# Patient Record
Sex: Male | Born: 1935 | Race: White | Hispanic: No | Smoking: Never smoker
Health system: Southern US, Community
[De-identification: ages and names within clinical notes are randomized; demographics above are authoritative.]

## PROBLEM LIST (undated history)

## (undated) DIAGNOSIS — I10 Essential (primary) hypertension: Secondary | ICD-10-CM

## (undated) DIAGNOSIS — C801 Malignant (primary) neoplasm, unspecified: Secondary | ICD-10-CM

## (undated) DIAGNOSIS — M1990 Unspecified osteoarthritis, unspecified site: Secondary | ICD-10-CM

## (undated) DIAGNOSIS — C61 Malignant neoplasm of prostate: Secondary | ICD-10-CM

## (undated) DIAGNOSIS — N179 Acute kidney failure, unspecified: Secondary | ICD-10-CM

## (undated) DIAGNOSIS — J189 Pneumonia, unspecified organism: Secondary | ICD-10-CM

## (undated) DIAGNOSIS — C4490 Unspecified malignant neoplasm of skin, unspecified: Secondary | ICD-10-CM

## (undated) HISTORY — DX: Acute kidney failure, unspecified: N17.9

## (undated) HISTORY — PX: BASAL CELL CARCINOMA EXCISION: SHX1214

## (undated) HISTORY — PX: PROSTATE BIOPSY: SHX241

## (undated) HISTORY — PX: TONSILLECTOMY: SUR1361

---

## 2009-12-15 ENCOUNTER — Emergency Department (HOSPITAL_BASED_OUTPATIENT_CLINIC_OR_DEPARTMENT_OTHER): Admission: EM | Admit: 2009-12-15 | Discharge: 2009-12-15 | Payer: Self-pay | Admitting: Emergency Medicine

## 2014-07-09 DIAGNOSIS — N179 Acute kidney failure, unspecified: Secondary | ICD-10-CM

## 2014-07-09 HISTORY — DX: Acute kidney failure, unspecified: N17.9

## 2014-08-01 ENCOUNTER — Inpatient Hospital Stay (HOSPITAL_COMMUNITY)
Admission: EM | Admit: 2014-08-01 | Discharge: 2014-08-05 | DRG: 684 | Disposition: A | Discharge status: Home Health | Payer: Medicare Other | Attending: Internal Medicine | Admitting: Internal Medicine

## 2014-08-01 ENCOUNTER — Emergency Department (HOSPITAL_COMMUNITY): Payer: Medicare Other

## 2014-08-01 ENCOUNTER — Encounter (HOSPITAL_COMMUNITY): Payer: Self-pay | Admitting: Emergency Medicine

## 2014-08-01 DIAGNOSIS — R339 Retention of urine, unspecified: Secondary | ICD-10-CM | POA: Diagnosis present

## 2014-08-01 DIAGNOSIS — D649 Anemia, unspecified: Secondary | ICD-10-CM | POA: Diagnosis not present

## 2014-08-01 DIAGNOSIS — Z85828 Personal history of other malignant neoplasm of skin: Secondary | ICD-10-CM | POA: Diagnosis not present

## 2014-08-01 DIAGNOSIS — I2789 Other specified pulmonary heart diseases: Secondary | ICD-10-CM | POA: Diagnosis present

## 2014-08-01 DIAGNOSIS — D509 Iron deficiency anemia, unspecified: Secondary | ICD-10-CM

## 2014-08-01 DIAGNOSIS — N400 Enlarged prostate without lower urinary tract symptoms: Secondary | ICD-10-CM

## 2014-08-01 DIAGNOSIS — I129 Hypertensive chronic kidney disease with stage 1 through stage 4 chronic kidney disease, or unspecified chronic kidney disease: Secondary | ICD-10-CM | POA: Diagnosis not present

## 2014-08-01 DIAGNOSIS — D638 Anemia in other chronic diseases classified elsewhere: Secondary | ICD-10-CM | POA: Diagnosis present

## 2014-08-01 DIAGNOSIS — N1330 Unspecified hydronephrosis: Secondary | ICD-10-CM | POA: Diagnosis present

## 2014-08-01 DIAGNOSIS — N401 Enlarged prostate with lower urinary tract symptoms: Secondary | ICD-10-CM | POA: Diagnosis present

## 2014-08-01 DIAGNOSIS — N138 Other obstructive and reflux uropathy: Secondary | ICD-10-CM | POA: Diagnosis present

## 2014-08-01 DIAGNOSIS — N059 Unspecified nephritic syndrome with unspecified morphologic changes: Secondary | ICD-10-CM | POA: Diagnosis present

## 2014-08-01 DIAGNOSIS — N058 Unspecified nephritic syndrome with other morphologic changes: Secondary | ICD-10-CM | POA: Diagnosis present

## 2014-08-01 DIAGNOSIS — N179 Acute kidney failure, unspecified: Principal | ICD-10-CM | POA: Diagnosis present

## 2014-08-01 DIAGNOSIS — N3090 Cystitis, unspecified without hematuria: Secondary | ICD-10-CM | POA: Diagnosis present

## 2014-08-01 DIAGNOSIS — N132 Hydronephrosis with renal and ureteral calculous obstruction: Secondary | ICD-10-CM

## 2014-08-01 DIAGNOSIS — N320 Bladder-neck obstruction: Secondary | ICD-10-CM | POA: Diagnosis not present

## 2014-08-01 DIAGNOSIS — I10 Essential (primary) hypertension: Secondary | ICD-10-CM | POA: Diagnosis present

## 2014-08-01 DIAGNOSIS — Z0389 Encounter for observation for other suspected diseases and conditions ruled out: Secondary | ICD-10-CM | POA: Diagnosis not present

## 2014-08-01 DIAGNOSIS — C61 Malignant neoplasm of prostate: Secondary | ICD-10-CM | POA: Diagnosis present

## 2014-08-01 DIAGNOSIS — N19 Unspecified kidney failure: Secondary | ICD-10-CM | POA: Diagnosis present

## 2014-08-01 DIAGNOSIS — R944 Abnormal results of kidney function studies: Secondary | ICD-10-CM | POA: Diagnosis not present

## 2014-08-01 HISTORY — DX: Malignant (primary) neoplasm, unspecified: C80.1

## 2014-08-01 LAB — BASIC METABOLIC PANEL
Anion gap: 16 — ABNORMAL HIGH (ref 5–15)
BUN: 52 mg/dL — ABNORMAL HIGH (ref 6–23)
CHLORIDE: 105 meq/L (ref 96–112)
CO2: 22 meq/L (ref 19–32)
CREATININE: 5.08 mg/dL — AB (ref 0.50–1.35)
Calcium: 8.7 mg/dL (ref 8.4–10.5)
GFR calc Af Amer: 11 mL/min — ABNORMAL LOW (ref >90)
GFR calc non Af Amer: 9 mL/min — ABNORMAL LOW (ref >90)
GLUCOSE: 86 mg/dL (ref 70–99)
Potassium: 4.2 mEq/L (ref 3.7–5.3)
Sodium: 143 mEq/L (ref 137–147)

## 2014-08-01 LAB — CBC WITH DIFFERENTIAL/PLATELET
Basophils Absolute: 0.1 10*3/uL (ref 0.0–0.1)
Basophils Relative: 1 % (ref 0–1)
EOS ABS: 0.2 10*3/uL (ref 0.0–0.7)
Eosinophils Relative: 2 % (ref 0–5)
HCT: 31.1 % — ABNORMAL LOW (ref 39.0–52.0)
Hemoglobin: 10.7 g/dL — ABNORMAL LOW (ref 13.0–17.0)
LYMPHS ABS: 1.6 10*3/uL (ref 0.7–4.0)
Lymphocytes Relative: 19 % (ref 12–46)
MCH: 29.3 pg (ref 26.0–34.0)
MCHC: 34.4 g/dL (ref 30.0–36.0)
MCV: 85.2 fL (ref 78.0–100.0)
MONOS PCT: 5 % (ref 3–12)
Monocytes Absolute: 0.4 10*3/uL (ref 0.1–1.0)
Neutro Abs: 6.4 10*3/uL (ref 1.7–7.7)
Neutrophils Relative %: 73 % (ref 43–77)
Platelets: 236 10*3/uL (ref 150–400)
RBC: 3.65 MIL/uL — AB (ref 4.22–5.81)
RDW: 13 % (ref 11.5–15.5)
WBC: 8.7 10*3/uL (ref 4.0–10.5)

## 2014-08-01 LAB — COMPREHENSIVE METABOLIC PANEL
ALK PHOS: 46 U/L (ref 39–117)
ALT: 7 U/L (ref 0–53)
AST: 12 U/L (ref 0–37)
Albumin: 3.8 g/dL (ref 3.5–5.2)
Anion gap: 15 (ref 5–15)
BUN: 53 mg/dL — ABNORMAL HIGH (ref 6–23)
CO2: 24 meq/L (ref 19–32)
Calcium: 9.1 mg/dL (ref 8.4–10.5)
Chloride: 105 mEq/L (ref 96–112)
Creatinine, Ser: 5.14 mg/dL — ABNORMAL HIGH (ref 0.50–1.35)
GFR calc Af Amer: 11 mL/min — ABNORMAL LOW (ref >90)
GFR, EST NON AFRICAN AMERICAN: 9 mL/min — AB (ref >90)
Glucose, Bld: 96 mg/dL (ref 70–99)
POTASSIUM: 4.8 meq/L (ref 3.7–5.3)
SODIUM: 144 meq/L (ref 137–147)
TOTAL PROTEIN: 7.8 g/dL (ref 6.0–8.3)
Total Bilirubin: 0.4 mg/dL (ref 0.3–1.2)

## 2014-08-01 LAB — URINALYSIS, ROUTINE W REFLEX MICROSCOPIC
BILIRUBIN URINE: NEGATIVE
Glucose, UA: NEGATIVE mg/dL
Hgb urine dipstick: NEGATIVE
Ketones, ur: NEGATIVE mg/dL
LEUKOCYTES UA: NEGATIVE
NITRITE: NEGATIVE
PH: 5.5 (ref 5.0–8.0)
Protein, ur: NEGATIVE mg/dL
Specific Gravity, Urine: 1.011 (ref 1.005–1.030)
Urobilinogen, UA: 0.2 mg/dL (ref 0.0–1.0)

## 2014-08-01 LAB — PHOSPHORUS: Phosphorus: 4.3 mg/dL (ref 2.3–4.6)

## 2014-08-01 LAB — RETICULOCYTES
RBC.: 3.32 MIL/uL — ABNORMAL LOW (ref 4.22–5.81)
Retic Count, Absolute: 13.3 10*3/uL — ABNORMAL LOW (ref 19.0–186.0)
Retic Ct Pct: 0.4 % (ref 0.4–3.1)

## 2014-08-01 LAB — TROPONIN I: Troponin I: <0.3 ng/mL (ref <0.30)

## 2014-08-01 LAB — MAGNESIUM: MAGNESIUM: 2.4 mg/dL (ref 1.5–2.5)

## 2014-08-01 MED ORDER — IOHEXOL 300 MG/ML  SOLN
25.0000 mL | Freq: Once | INTRAMUSCULAR | Status: AC | PRN
  Administered 2014-08-01: 25 mL via ORAL

## 2014-08-01 MED ORDER — ACETAMINOPHEN 650 MG RE SUPP: 650.0000 mg | Freq: Four times a day (QID) | RECTAL | Status: DC | PRN

## 2014-08-01 MED ORDER — SODIUM CHLORIDE 0.9 % IV SOLN
Freq: Once | INTRAVENOUS | Status: AC
  Administered 2014-08-01: 20:00:00 via INTRAVENOUS

## 2014-08-01 MED ORDER — HYDRALAZINE HCL 20 MG/ML IJ SOLN
5.0000 mg | INTRAMUSCULAR | Status: AC
  Administered 2014-08-01: 5 mg via INTRAVENOUS
  Filled 2014-08-01: qty 1

## 2014-08-01 MED ORDER — SODIUM CHLORIDE 0.9 % IV BOLUS (SEPSIS)
1000.0000 mL | Freq: Once | INTRAVENOUS | Status: AC
  Administered 2014-08-01: 1000 mL via INTRAVENOUS

## 2014-08-01 MED ORDER — IOHEXOL 300 MG/ML  SOLN
100.0000 mL | Freq: Once | INTRAMUSCULAR | Status: AC | PRN
  Administered 2014-08-01: 100 mL via INTRAVENOUS

## 2014-08-01 MED ORDER — ACETAMINOPHEN 325 MG PO TABS: 650.0000 mg | ORAL_TABLET | Freq: Four times a day (QID) | ORAL | Status: DC | PRN

## 2014-08-01 NOTE — Consult Note (Addendum)
consult for: Urinary retention bilateral hydronephrosis, acute renal failure Requested by: Dr. Addison Lank Piepenbrink   History of Present Illness: Patient is an 80 year old male who complains of a several week history of weak stream and urinary incontinence. His incontinence comes on without awareness but also with urgency. He's had no dysuria or gross hematuria. He was noted to be in acute renal failure. His urinalysis was clear. A CT scan of the abdomen and pelvis was obtained which showed bilateral hydroureteronephrosis down to a distended bladder, the prostate was approximately 125 g, there are bilateral renal cyst 4 cm on right and a much smaller one on the left. I reviewed all the images.  The patient denies prior history of BPH or other urologic history. He reports he typically voids with a good stream without frequency and urgency up until weeks ago.  Past Medical History  Diagnosis Date  . Cancer    Past Surgical History  Procedure Laterality Date  . Basal cell carcinoma excision      Home Medications:   (Not in a hospital admission) Allergies: No Known Allergies  No family history on file. Social History:  reports that he has never smoked. He does not have any smokeless tobacco history on file. He reports that he does not drink alcohol. His drug history is not on file.  ROS: A complete review of systems was performed.  All systems are negative except for pertinent findings as noted. Review of Systems  All other systems reviewed and are negative.    Physical Exam:  Vital signs in last 24 hours: Temp:  [98.9 F (37.2 C)] 98.9 F (37.2 C) (08/24 1157) Pulse Rate:  [54-67] 63 (08/24 1915) Resp:  [11-21] 18 (08/24 1915) BP: (175-240)/(71-103) 213/72 mmHg (08/24 1915) SpO2:  [94 %-100 %] 98 % (08/24 1915) General:  Alert and oriented, No acute distress HEENT: Normocephalic, atraumatic Neck: No JVD or lymphadenopathy Cardiovascular: Regular rate and rhythm Lungs:  Regular rate and effort Abdomen: Soft, nontender, nondistended, bladder is tense and palpably distended above his umbilicus. Back: No CVA tenderness Extremities: No edema Neurologic: Grossly intact GU: The penis appears normal. The testicles are descended bilaterally and palpably normal.  I discussed with the patient the nature risks benefits and alternatives to Foley catheter. We discussed risks of bleeding and infection among others. He elected to proceed.  Procedure: The penis was prepped with Betadine and an 11 [place] coud catheter was placed without difficulty. The balloon was inflated and seated at the bladder neck. He drained about 2300 mL of clear yellow urine. He felt much relief. He remained stable.  Laboratory Data:  Results for orders placed during the hospital encounter of 08/01/14 (from the past 24 hour(s))  URINALYSIS, ROUTINE W REFLEX MICROSCOPIC     Status: None   Collection Time    08/01/14 12:03 PM      Result Value Ref Range   Color, Urine YELLOW  YELLOW   APPearance CLEAR  CLEAR   Specific Gravity, Urine 1.011  1.005 - 1.030   pH 5.5  5.0 - 8.0   Glucose, UA NEGATIVE  NEGATIVE mg/dL   Hgb urine dipstick NEGATIVE  NEGATIVE   Bilirubin Urine NEGATIVE  NEGATIVE   Ketones, ur NEGATIVE  NEGATIVE mg/dL   Protein, ur NEGATIVE  NEGATIVE mg/dL   Urobilinogen, UA 0.2  0.0 - 1.0 mg/dL   Nitrite NEGATIVE  NEGATIVE   Leukocytes, UA NEGATIVE  NEGATIVE  COMPREHENSIVE METABOLIC PANEL     Status: Abnormal  Collection Time    08/01/14  2:17 PM      Result Value Ref Range   Sodium 144  137 - 147 mEq/L   Potassium 4.8  3.7 - 5.3 mEq/L   Chloride 105  96 - 112 mEq/L   CO2 24  19 - 32 mEq/L   Glucose, Bld 96  70 - 99 mg/dL   BUN 53 (*) 6 - 23 mg/dL   Creatinine, Ser 5.14 (*) 0.50 - 1.35 mg/dL   Calcium 9.1  8.4 - 10.5 mg/dL   Total Protein 7.8  6.0 - 8.3 g/dL   Albumin 3.8  3.5 - 5.2 g/dL   AST 12  0 - 37 U/L   ALT 7  0 - 53 U/L   Alkaline Phosphatase 46  39 - 117 U/L    Total Bilirubin 0.4  0.3 - 1.2 mg/dL   GFR calc non Af Amer 9 (*) >90 mL/min   GFR calc Af Amer 11 (*) >90 mL/min   Anion gap 15  5 - 15  CBC WITH DIFFERENTIAL     Status: Abnormal   Collection Time    08/01/14  2:17 PM      Result Value Ref Range   WBC 8.7  4.0 - 10.5 K/uL   RBC 3.65 (*) 4.22 - 5.81 MIL/uL   Hemoglobin 10.7 (*) 13.0 - 17.0 g/dL   HCT 31.1 (*) 39.0 - 52.0 %   MCV 85.2  78.0 - 100.0 fL   MCH 29.3  26.0 - 34.0 pg   MCHC 34.4  30.0 - 36.0 g/dL   RDW 13.0  11.5 - 15.5 %   Platelets 236  150 - 400 K/uL   Neutrophils Relative % 73  43 - 77 %   Neutro Abs 6.4  1.7 - 7.7 K/uL   Lymphocytes Relative 19  12 - 46 %   Lymphs Abs 1.6  0.7 - 4.0 K/uL   Monocytes Relative 5  3 - 12 %   Monocytes Absolute 0.4  0.1 - 1.0 K/uL   Eosinophils Relative 2  0 - 5 %   Eosinophils Absolute 0.2  0.0 - 0.7 K/uL   Basophils Relative 1  0 - 1 %   Basophils Absolute 0.1  0.0 - 0.1 K/uL   No results found for this or any previous visit (from the past 240 hour(s)). Creatinine:  Recent Labs  08/01/14 1417  CREATININE 5.14*    Impression/Assessment/plan: BPH - we discussed the long-term management of BPH and the nature risk and benefits of continued surveillance, alpha blockers, 5 alpha reductase inhibitors and procedures. He needs to be started on combination therapy with tamsulosin and finasteride. He wants to avoid surgery. We discussed FDA warnings regarding finasteride such as sexual dysfunction and high-grade prostate cancer, but we also discussed the benefits of finasteride. Urinary retention - resolved with Foley catheter. Bilateral hydronephrosis, ARF - patient needs close monitoring for postobstructive diuresis (POD). These individuals should have their urine output recorded every 2 hours and vital signs checked every 6 to 8 hours. Further, their serum electrolyte (especially potassium), magnesium, phosphate, urea, and creatinine levels should be checked every 12 hours  or more  frequently as necessary and corrected if necessary. Pt should be allowed to drink fluids. If the patient's urine output exceeds 200 mL per hour for 2 consecutive hours, or is greater than 3 L over 24 hours, then this is diagnostic of physiologic POD and requires closer monitoring for conversion to pathologic  POD. Fluid balance should be closely monitored and a negative balance should be targeted in these patients. It is recommended to replace 75% of the previous 1-hour urinary output.   Will follow.   Add: I should add we also discussed the nature, r/b of PSA screening, the nature of elevated PSA, the management of PCa might include surveillance vs. treatment depending on pt age, stage, grade, etc.   Festus Aloe 08/01/2014, 7:45 PM

## 2014-08-01 NOTE — ED Notes (Addendum)
Bladder Scan reading >986mL, accuracy is in question due to palpable mass in abdomen. EDPA made aware.

## 2014-08-01 NOTE — ED Notes (Signed)
Urology at bedside.

## 2014-08-01 NOTE — ED Notes (Signed)
Spoke with Jeral Fruit, Social Worker and Neurosurgeon regarding patient's living arrangements

## 2014-08-01 NOTE — ED Notes (Signed)
Baltazar Najjar, NP notified of Dr. Lyndal Rainbow request for brisk post-obstructive diuresis.

## 2014-08-01 NOTE — H&P (Signed)
[facility] History and Physical  Austin Nicholson NGE:[id number] DOB: [birth year] DOA: 08/01/2014  Referring physician: EDP PCP: No PCP Per Patient   Chief Complaint: Incontinence  HPI: Austin Nicholson is an 80 y.o. male with a history of basal cell cancer who presents with above complaints. He is a poor historian. He reports that  for the past several weeks he has urinary incontinence requiring use of adult diapers and also noted that his urine output is decreased. He reports that a couple of weeks ago he noted a 'lump'in his abdomen-he does not have a PCP so his family friends took him 3 days ago to see Dr. Arelia Nicholson who is their PCP. Per patient he recommended the patient come to the hospital but he thought the lump was reducing in size so he decided not to at the time. Last night he was convinced by his friends to come to the ED today for further evaluation. Patient denies abdominal pain, fevers, cough, shortness of breath diarrhea melena and no chest pain. He was seen in the ED and labs revealed a creatinine of 5.14 with a BUN of 53. A CT scan of the abdomen and pelvis Was done and showed pronounced bladder distention causing severe hydronephrosis with enlargement of the prostate. Also multiple nonacute findings including tiny liver lesion renal cyst adrenal nodule-see full report of CT, when noted. Urology was consulted and patient is admitted for further valuation and management.  Review of Systems The patient denies anorexia, fever, weight loss,, vision loss, decreased hearing, hoarseness, chest pain, syncope, dyspnea on exertion, peripheral edema, balance deficits, hemoptysis, abdominal pain, melena, hematochezia, severe indigestion/heartburn, hematuria, muscle weakness, suspicious skin lesions, transient blindness, difficulty walking, depression, unusual weight change, abnormal bleeding.   Past Medical History  Diagnosis Date  . Cancer    Past Surgical History  Procedure Laterality Date   . Basal cell carcinoma excision     Social History:  reports that he has never smoked. He does not have any smokeless tobacco history on file. He reports that he does not drink alcohol. His drug history is not on file.  No Known Allergies  No family history on file.   Prior to Admission medications   Medication Sig Start Date End Date Taking? Authorizing Provider  Nutritional Supplements (JUICE PLUS FIBRE PO) Take 1 capsule by mouth daily.   Yes Historical Provider, MD   Physical Exam: Filed Vitals:   08/01/14 1615  BP: 203/94  Pulse: 57  Temp:   Resp: 17    BP 203/94  Pulse 57  Temp(Src) 98.9 F (37.2 C) (Oral)  Resp 17  SpO2 97% Constitutional: Vital signs reviewed.  Patient is a well-developed and well-nourished  in no acute distress and cooperative with exam. Alert and oriented x3.  Head: Normocephalic and right frontal area scarring Mouth: no erythema or exudates, MMM Eyes: PERRL, EOMI, conjunctivae normal, No scleral icterus.  Neck: Supple, Trachea midline normal ROM, No JVD, mass, thyromegaly, or carotid bruit present.  Cardiovascular: RRR, S1 normal, S2 normal, no MRG, pulses symmetric and intact bilaterally Pulmonary/Chest: normal respiratory effort, CTAB, no wheezes, rales, or rhonchi Abdominal: Soft. Non-tender, his bladder is palpable above his umbilicus in the upper abdomen non-tender, bowel sounds are present, no guarding present.  GU: no CVA tenderness  extremities: No cyanosis and no edema  Neurological: A&O x3, Strength is normal and symmetric bilaterally, cranial nerve II-XII are grossly intact, no focal motor deficit, sensory intact to light touch bilaterally.  Skin: Warm, dry and  intact. No rash, cyanosis, or clubbing.  Psychiatric: Normal mood and affect. speech and behavior is normal. Judgment and thought content normal. Cognition and memory are normal.                Labs on Admission:  Basic Metabolic Panel:  Recent Labs Lab 08/01/14 1417   NA 144  K 4.8  CL 105  CO2 24  GLUCOSE 96  BUN 53*  CREATININE 5.14*  CALCIUM 9.1   Liver Function Tests:  Recent Labs Lab 08/01/14 1417  AST 12  ALT 7  ALKPHOS 46  BILITOT 0.4  PROT 7.8  ALBUMIN 3.8   No results found for this basename: LIPASE, AMYLASE,  in the last 168 hours No results found for this basename: AMMONIA,  in the last 168 hours CBC:  Recent Labs Lab 08/01/14 1417  WBC 8.7  NEUTROABS 6.4  HGB 10.7*  HCT 31.1*  MCV 85.2  PLT 236   Cardiac Enzymes: No results found for this basename: CKTOTAL, CKMB, CKMBINDEX, TROPONINI,  in the last 168 hours  BNP (last 3 results) No results found for this basename: PROBNP,  in the last 8760 hours CBG: No results found for this basename: GLUCAP,  in the last 168 hours  Radiological Exams on Admission: Ct Abdomen Pelvis W Contrast  08/01/2014   CLINICAL DATA:  Urinary incontinence for several weeks may urinary frequency, abdomen mass  EXAM: CT ABDOMEN AND PELVIS WITH CONTRAST  TECHNIQUE: Multidetector CT imaging of the abdomen and pelvis was performed using the standard protocol following bolus administration of intravenous contrast.  CONTRAST:  143mL OMNIPAQUE IOHEXOL 300 MG/ML  SOLN  COMPARISON:  None.  FINDINGS: Discoid atelectasis left lung base. 2 mm nodular opacity right lung base, pleural-based, most likely a tiny focus of peripheral atelectasis.  4 mm low-attenuation lesion posterior right lobe of liver image 21, too small characterize. Liver otherwise normal. Gallbladder and spleen normal. Pancreas normal. Left adrenal gland normal. 8 mm right adrenal nodule.  Calcified aorta without dilatation. Severe bilateral hydronephrosis. 4.3 cm cystic lesion lower pole right kidney with thin stripe of hyperattenuation in the posterior ram. There are few tiny low-attenuation lesions in the midpole left kidney which are too small characterize.  No ascites. No significant adenopathy in the abdomen or pelvis. No acute  musculoskeletal findings or suspicious focal osseous findings.  Markedly bladder distension. Bladder is distended to 16 x 15 x 15 cm. Prostate is enlarged to a diameter 6.3 x 5.9 cm.  IMPRESSION: 1. Pronounced bladder distension causing severe hydronephrosis. There is enlargement of the prostate. Correlate with PSA levels, with differential diagnostic possibilities including prostate hypertrophy and carcinoma. 2. Multiple nonacute findings including tiny liver lesion that is too small to characterize in which may be a cyst and a tiny adrenal nodule on the right which does not require further workup based on small 5. Right renal lesion may represent a cyst with some dense material layering dependently or thin focus of calcification in the Re and. No precontrast images available on this study and therefore min enhancement not excluded. Consider renal ultrasound or renal protocol CT to characterize further. Also tiny left renal lesions too small to characterize but which may represent cysts.   Electronically Signed   By: Skipper Cliche M.D.   On: 08/01/2014 17:13     Assessment/Plan   Present on Admission:  . bilateral Hydronephrosis, severe  -Likely Secondary to BPH  -Urology consulted for Foley placement and further recommendations  .  Renal failure, secondary to bladder outlet obstruction  -As above urology consulted for further recommendations >> followup on eval/Recs -Followup and monitor closely for post obstructive diuresis and replace electrolytes as appropriate  -Admit to step down for close monitoring of strict I and os and vitals  -Uncontrolled hypertension might also be a contributing factor to his renal failure . HTN (hypertension), malignant -Hydralazine when necessary  -Start Norvasc and follow  -Obtain troponins, echo follow. . Enlarged prostate -Check PSA -Urology consulted as above, follow up for his recommendations  . Anemia Obtain anemia panel, stool guaiacs>> follow and  further manage accordingly -Renal failure possible contributing factor>> duration of renal failure unclear since he's not seen a PCP for long time until 3 days ago as above.      Code Status: Full Family Communication: Family friend at bedside Disposition Plan: Admit to stepdown  Time spent: >30  [facility] Pager [phone]

## 2014-08-01 NOTE — ED Notes (Signed)
Pt reports urinary incontinence for several weeks, is wearing diaper, reports urinary frequency. Also, mass in abdominal for several weeks; PCP examined, reports is reduced in size. Denies pain. Pt is a x 4.

## 2014-08-01 NOTE — Progress Notes (Signed)
Clinical Social Work Department BRIEF PSYCHOSOCIAL ASSESSMENT 08/01/2014  Patient:  Austin Nicholson,Austin Nicholson     Account Number:  [id number]     Admit date:  08/01/2014  Clinical Social Worker:  Ulyess Blossom  Date/Time:  08/01/2014 11:13 PM  Referred by:  RN  Date Referred:  08/01/2014 Referred for  Homelessness   Other Referral:   Interview type:  Patient Other interview type:   Database review.    PSYCHOSOCIAL DATA Living Status:  ALONE Admitted from facility:   Level of care:   Primary support name:  suzanne pace Primary support relationship to patient:  FRIEND Degree of support available:   Unknown at this time.    CURRENT CONCERNS Current Concerns  Other - See comment   Other Concerns:   ?homelessness/disposition planning    SOCIAL WORK ASSESSMENT / PLAN Met with pt re: role of CSW/d/c planning.  Pt maintains that he is not homeless and he rents a house in [place]. 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  Pt reports staying with a family in [place] recently and once they learned he was staying in his car, they invited him into their home.  Per pt, he teaches the family's children piano lessons.  Family also brought pt to ED when they realized he was ill.  Pt is unsure if he can go back to staying with this family at d/c.  Per pt, he has not given any thought to his disposition.  Pt states that his house in [place] is "ancient" and that he has "accumulated" a lot of stuff and needs a bigger place.  Pt states that he has 2 large pianos a requires a large space so he can teach piano lessons in his home and needs high ceilings for "sound quality."  Pt also mentioned that various people give him food and various things and he has "accumulated" many things/food in his car.   Assessment/plan status:  Psychosocial Support/Ongoing Assessment of Needs Other assessment/ plan:   Information/referral to  community resources:    PATIENT'S/FAMILY'S RESPONSE TO PLAN OF CARE: Pt very complimentary of the care he has received while in the ED.  Pt is receptive to Care Management and CSW assisting him with d/c planning.

## 2014-08-01 NOTE — ED Notes (Signed)
Admitting MD at bedside.

## 2014-08-01 NOTE — ED Notes (Signed)
Paged Baltazar Najjar, NP regarding clots noted in urine, states no change in plan of care at this time.

## 2014-08-01 NOTE — ED Notes (Signed)
CT notified pt finished drinking contrast  

## 2014-08-01 NOTE — ED Provider Notes (Signed)
CSN: [id number]     Arrival date & time 08/01/14  1141 History   First MD Initiated Contact with Patient 08/01/14 1343     Chief Complaint  Patient presents with  . Urinary Incontinence     (Consider location/radiation/quality/duration/timing/severity/associated sxs/prior Treatment) HPI Comments: Patient is an 80 yo M PMHx significant for hx of basal cell carcinoma presenting to the ED for several weeks of urinary incontinence. Patient states that he does have sensation that he needs to use the restroom, attempts to urinate but does not feel that he completely empties his bladder with each urination. Patient states he has required the use of an adult diaper. He is also endorsing associated urinary frequency. Patient states he has had a mass in his abdomen that he has been followed by his PCP for, and per the patient states it has been reducing in size. Denies any pain. Denies any fevers, chills, nausea, vomiting, diarrhea, constipation, abdominal pain, numbness in his lower extremities, saddle anesthesia. No abdominal surgical history.    Past Medical History  Diagnosis Date  . Cancer    Past Surgical History  Procedure Laterality Date  . Basal cell carcinoma excision     No family history on file. History  Substance Use Topics  . Smoking status: Never Smoker   . Smokeless tobacco: Not on file  . Alcohol Use: No    Review of Systems  Genitourinary: Positive for frequency.       Urinary incontinence.   Musculoskeletal: Negative for back pain.  Neurological: Negative for weakness and numbness.  All other systems reviewed and are negative.     Allergies  Review of patient's allergies indicates no known allergies.  Home Medications   Prior to Admission medications   Medication Sig Start Date End Date Taking? Authorizing Provider  Nutritional Supplements (JUICE PLUS FIBRE PO) Take 1 capsule by mouth daily.   Yes Historical Provider, MD   BP 203/94  Pulse 57  Temp(Src)  98.9 F (37.2 C) (Oral)  Resp 17  SpO2 97% Physical Exam  Nursing note and vitals reviewed. Constitutional: He is oriented to person, place, and time. He appears well-developed and well-nourished. No distress.  HENT:  Head: Normocephalic and atraumatic.  Right Ear: External ear normal.  Left Ear: External ear normal.  Nose: Nose normal.  Mouth/Throat: Oropharynx is clear and moist. No oropharyngeal exudate.  Eyes: Conjunctivae and EOM are normal. Pupils are equal, round, and reactive to light.  Neck: Normal range of motion. Neck supple.  Cardiovascular: Normal rate, regular rhythm, normal heart sounds and intact distal pulses.   Pulmonary/Chest: Effort normal and breath sounds normal. No respiratory distress.  Abdominal: Soft. Bowel sounds are normal. He exhibits mass. He exhibits no distension. There is no tenderness. There is no rebound and no guarding.  Musculoskeletal: Normal range of motion. He exhibits no edema.  Neurological: He is alert and oriented to person, place, and time. He has normal strength. No cranial nerve deficit. Gait normal. GCS eye subscore is 4. GCS verbal subscore is 5. GCS motor subscore is 6.  Sensation grossly intact.  No pronator drift.  Bilateral heel-knee-shin intact.  Skin: Skin is warm and dry. He is not diaphoretic.    ED Course  Procedures (including critical care time) Medications  iohexol (OMNIPAQUE) 300 MG/ML solution 25 mL (25 mLs Oral Contrast Given 08/01/14 1446)  hydrALAZINE (APRESOLINE) injection 5 mg (5 mg Intravenous Given 08/01/14 1700)  iohexol (OMNIPAQUE) 300 MG/ML solution 100 mL (100 mLs Intravenous  Contrast Given 08/01/14 1627)    Labs Review Labs Reviewed  COMPREHENSIVE METABOLIC PANEL - Abnormal; Notable for the following:    BUN 53 (*)    Creatinine, Ser 5.14 (*)    GFR calc non Af Amer 9 (*)    GFR calc Af Amer 11 (*)    All other components within normal limits  CBC WITH DIFFERENTIAL - Abnormal; Notable for the following:     RBC 3.65 (*)    Hemoglobin 10.7 (*)    HCT 31.1 (*)    All other components within normal limits  URINE CULTURE  URINALYSIS, ROUTINE W REFLEX MICROSCOPIC  PSA  FREE PSA    Imaging Review Ct Abdomen Pelvis W Contrast  08/01/2014   CLINICAL DATA:  Urinary incontinence for several weeks may urinary frequency, abdomen mass  EXAM: CT ABDOMEN AND PELVIS WITH CONTRAST  TECHNIQUE: Multidetector CT imaging of the abdomen and pelvis was performed using the standard protocol following bolus administration of intravenous contrast.  CONTRAST:  180mL OMNIPAQUE IOHEXOL 300 MG/ML  SOLN  COMPARISON:  None.  FINDINGS: Discoid atelectasis left lung base. 2 mm nodular opacity right lung base, pleural-based, most likely a tiny focus of peripheral atelectasis.  4 mm low-attenuation lesion posterior right lobe of liver image 21, too small characterize. Liver otherwise normal. Gallbladder and spleen normal. Pancreas normal. Left adrenal gland normal. 8 mm right adrenal nodule.  Calcified aorta without dilatation. Severe bilateral hydronephrosis. 4.3 cm cystic lesion lower pole right kidney with thin stripe of hyperattenuation in the posterior ram. There are few tiny low-attenuation lesions in the midpole left kidney which are too small characterize.  No ascites. No significant adenopathy in the abdomen or pelvis. No acute musculoskeletal findings or suspicious focal osseous findings.  Markedly bladder distension. Bladder is distended to 16 x 15 x 15 cm. Prostate is enlarged to a diameter 6.3 x 5.9 cm.  IMPRESSION: 1. Pronounced bladder distension causing severe hydronephrosis. There is enlargement of the prostate. Correlate with PSA levels, with differential diagnostic possibilities including prostate hypertrophy and carcinoma. 2. Multiple nonacute findings including tiny liver lesion that is too small to characterize in which may be a cyst and a tiny adrenal nodule on the right which does not require further workup based on  small 5. Right renal lesion may represent a cyst with some dense material layering dependently or thin focus of calcification in the Re and. No precontrast images available on this study and therefore min enhancement not excluded. Consider renal ultrasound or renal protocol CT to characterize further. Also tiny left renal lesions too small to characterize but which may represent cysts.   Electronically Signed   By: Skipper Cliche M.D.   On: 08/01/2014 17:13     EKG Interpretation   Date/Time:  Monday August 01 2014 13:56:07 EDT Ventricular Rate:  59 PR Interval:  304 QRS Duration: 100 QT Interval:  433 QTC Calculation: 429 R Axis:   73 Text Interpretation:  Sinus rhythm Prolonged PR interval Baseline wander  in lead(s) V3 No previous ECGs available Confirmed by Wyvonnia Dusky  MD, [place]  [phone]) on 08/01/2014 4:55:58 PM      MDM   Final diagnoses:  None    Filed Vitals:   08/01/14 1615  BP: 203/94  Pulse: 57  Temp:   Resp: 17   5:25 PM Discussed patient with Dr. Junious Silk who recommends medical admission, he will come by to consult on patient and place foley catheter.    I have  reviewed nursing notes, vital signs, and all appropriate lab and imaging results for this patient. Hydralazine given for BP, improvement noted. Patient will be admitted to stepdown for further management and evaluation. Patient d/w with Dr. Zenia Resides, agrees with plan.      Harlow Mares, PA-C 08/01/14 1731

## 2014-08-01 NOTE — ED Provider Notes (Signed)
Medical screening examination/treatment/procedure(s) were conducted as a shared visit with non-physician practitioner(s) and myself.  I personally evaluated the patient during the encounter.   EKG Interpretation None     Patient here with worsening urinary incontinence. His lab work shows renal failure. She will be admitted to the hospitalist  Leota Jacobsen, MD 08/01/14 [phone]

## 2014-08-02 DIAGNOSIS — I369 Nonrheumatic tricuspid valve disorder, unspecified: Secondary | ICD-10-CM

## 2014-08-02 LAB — CBC
HCT: 33.7 % — ABNORMAL LOW (ref 39.0–52.0)
HEMOGLOBIN: 11.2 g/dL — AB (ref 13.0–17.0)
MCH: 28.7 pg (ref 26.0–34.0)
MCHC: 33.2 g/dL (ref 30.0–36.0)
MCV: 86.4 fL (ref 78.0–100.0)
Platelets: 216 10*3/uL (ref 150–400)
RBC: 3.9 MIL/uL — ABNORMAL LOW (ref 4.22–5.81)
RDW: 13.2 % (ref 11.5–15.5)
WBC: 9.8 10*3/uL (ref 4.0–10.5)

## 2014-08-02 LAB — BASIC METABOLIC PANEL
Anion gap: 14 (ref 5–15)
Anion gap: 14 (ref 5–15)
BUN: 50 mg/dL — AB (ref 6–23)
BUN: 52 mg/dL — AB (ref 6–23)
CALCIUM: 8.3 mg/dL — AB (ref 8.4–10.5)
CO2: 20 mEq/L (ref 19–32)
CO2: 23 mEq/L (ref 19–32)
CREATININE: 4.6 mg/dL — AB (ref 0.50–1.35)
CREATININE: 4.7 mg/dL — AB (ref 0.50–1.35)
Calcium: 8 mg/dL — ABNORMAL LOW (ref 8.4–10.5)
Chloride: 107 mEq/L (ref 96–112)
Chloride: 103 mEq/L (ref 96–112)
GFR calc Af Amer: 12 mL/min — ABNORMAL LOW (ref >90)
GFR calc non Af Amer: 11 mL/min — ABNORMAL LOW (ref >90)
GFR, EST AFRICAN AMERICAN: 12 mL/min — AB (ref >90)
GFR, EST NON AFRICAN AMERICAN: 10 mL/min — AB (ref >90)
GLUCOSE: 123 mg/dL — AB (ref 70–99)
Glucose, Bld: 125 mg/dL — ABNORMAL HIGH (ref 70–99)
Potassium: 4.8 mEq/L (ref 3.7–5.3)
Potassium: 3.9 mEq/L (ref 3.7–5.3)
Sodium: 141 mEq/L (ref 137–147)
Sodium: 140 mEq/L (ref 137–147)

## 2014-08-02 LAB — PHOSPHORUS
Phosphorus: 4.2 mg/dL (ref 2.3–4.6)
Phosphorus: 4 mg/dL (ref 2.3–4.6)

## 2014-08-02 LAB — IRON AND TIBC
Iron: 58 ug/dL (ref 42–135)
Saturation Ratios: 30 % (ref 20–55)
TIBC: 191 ug/dL — AB (ref 215–435)
UIBC: 133 ug/dL (ref 125–400)

## 2014-08-02 LAB — FOLATE: Folate: 18.1 ng/mL

## 2014-08-02 LAB — FERRITIN: FERRITIN: 440 ng/mL — AB (ref 22–322)

## 2014-08-02 LAB — GLUCOSE, CAPILLARY: GLUCOSE-CAPILLARY: 144 mg/dL — AB (ref 70–99)

## 2014-08-02 LAB — TROPONIN I
Troponin I: <0.3 ng/mL (ref <0.30)
Troponin I: <0.3 ng/mL (ref <0.30)

## 2014-08-02 LAB — FREE PSA
PSA, Free Pct: 7 % — ABNORMAL LOW (ref >25)
PSA, Free: 21.09 ng/mL

## 2014-08-02 LAB — MAGNESIUM
MAGNESIUM: 2.2 mg/dL (ref 1.5–2.5)
MAGNESIUM: 2.1 mg/dL (ref 1.5–2.5)

## 2014-08-02 LAB — VITAMIN B12: Vitamin B-12: 203 pg/mL — ABNORMAL LOW (ref 211–911)

## 2014-08-02 LAB — PSA: PSA: 299 ng/mL — ABNORMAL HIGH (ref <=4.00)

## 2014-08-02 LAB — MRSA PCR SCREENING: MRSA BY PCR: NEGATIVE

## 2014-08-02 MED ORDER — HYDRALAZINE HCL 20 MG/ML IJ SOLN
10.0000 mg | INTRAMUSCULAR | Status: DC | PRN
  Administered 2014-08-02: 10 mg via INTRAVENOUS
  Filled 2014-08-02: qty 1

## 2014-08-02 MED ORDER — ONDANSETRON HCL 4 MG/2ML IJ SOLN: 4.0000 mg | Freq: Four times a day (QID) | INTRAMUSCULAR | Status: DC | PRN

## 2014-08-02 MED ORDER — AMLODIPINE BESYLATE 10 MG PO TABS
10.0000 mg | ORAL_TABLET | Freq: Every day | ORAL | Status: DC
Start: 2014-08-02 — End: 2014-08-05
  Administered 2014-08-02 – 2014-08-05 (×4): 10 mg via ORAL
  Filled 2014-08-02 (×3): qty 1
  Filled 2014-08-02: qty 2

## 2014-08-02 MED ORDER — SODIUM CHLORIDE 0.9 % IV SOLN
INTRAVENOUS | Status: DC
  Administered 2014-08-02 – 2014-08-03 (×3): via INTRAVENOUS

## 2014-08-02 MED ORDER — ENOXAPARIN SODIUM 30 MG/0.3ML ~~LOC~~ SOLN
30.0000 mg | SUBCUTANEOUS | Status: DC
  Administered 2014-08-02 – 2014-08-03 (×2): 30 mg via SUBCUTANEOUS
  Filled 2014-08-02 (×5): qty 0.3

## 2014-08-02 MED ORDER — PNEUMOCOCCAL VAC POLYVALENT 25 MCG/0.5ML IJ INJ
0.5000 mL | INJECTION | INTRAMUSCULAR | Status: DC
  Filled 2014-08-02: qty 0.5

## 2014-08-02 MED ORDER — SODIUM CHLORIDE 0.9 % IV BOLUS (SEPSIS)
1000.0000 mL | Freq: Once | INTRAVENOUS | Status: AC
  Administered 2014-08-02: 1000 mL via INTRAVENOUS

## 2014-08-02 MED ORDER — ONDANSETRON HCL 4 MG PO TABS: 4.0000 mg | ORAL_TABLET | Freq: Four times a day (QID) | ORAL | Status: DC | PRN

## 2014-08-02 NOTE — Progress Notes (Signed)
  Echocardiogram 2D Echocardiogram has been performed.  Austin Nicholson M 08/02/2014, 2:10 PM

## 2014-08-02 NOTE — ED Notes (Signed)
Patient denies pain and is resting comfortably.  

## 2014-08-02 NOTE — Progress Notes (Addendum)
[facility] PROGRESS NOTE  Austin Nicholson GSU:[id number] DOB: [birth year] DOA: 08/01/2014 PCP: No PCP Per Patient  Assessment/Plan: . bilateral Hydronephrosis, severe  -Status post Foley catheter/coude placement 8/24 with 2550cc, appreciate urology assistance -His PSA is elevated at 299, urology to follow for further recommendations -Noted patient does not want surgical intervention . Renal failure, secondary to bladder outlet obstruction  -As above status post Foley catheter placement -monitor closely for post obstructive diuresis and replace electrolytes as appropriate- urea, mag, phos ordered every 12 hours as recommended follow and replete. -Monitor urine output every 2 hours as per urology recommendations-replace 75% of the previous 1-hour urinary output. -close monitoring in step down setting for next 24hrs, follow -Uncontrolled hypertension might also be a contributing factor to his renal failure . HTN (hypertension), malignant  -Better BP control, continue Hydralazine when necessary  -Continue Norvasc - troponins negative, await echo. . Enlarged prostate  -PSA elevated at 299 -Urology to follow up for further recommendations  . Anemia -Followup and pending anemia panel and stool guaiacs -Renal failure possible contributing factor as well  Code Status: full Family Communication:none at bedside Disposition Plan: pending clinical course   Consultants:  urology  Procedures:  none  Antibiotics:  none   HPI/Subjective:  Patient denies any complaints, states he feels more comfortable this morning.  Objective: Filed Vitals:   08/02/14 0742  BP: 157/78  Pulse:   Temp:   Resp: 20    Intake/Output Summary (Last 24 hours) at 08/02/14 0836 Last data filed at 08/02/14 0600  Gross per 24 hour  Intake      0 ml  Output   4850 ml  Net  -4850 ml   There were no vitals filed for this visit.  Exam:  General: alert & oriented x 3 In NAD Cardiovascular: RRR, nl  S1 s2 Respiratory: CTAB Abdomen: soft +BS NT/ND, no masses palpable Extremities: No cyanosis and no edema.    Data Reviewed: Basic Metabolic Panel:  Recent Labs Lab 08/01/14 1417 08/01/14 2214  NA 144 143  K 4.8 4.2  CL 105 105  CO2 24 22  GLUCOSE 96 86  BUN 53* 52*  CREATININE 5.14* 5.08*  CALCIUM 9.1 8.7  MG  --  2.4  PHOS  --  4.3   Liver Function Tests:  Recent Labs Lab 08/01/14 1417  AST 12  ALT 7  ALKPHOS 46  BILITOT 0.4  PROT 7.8  ALBUMIN 3.8   No results found for this basename: LIPASE, AMYLASE,  in the last 168 hours No results found for this basename: AMMONIA,  in the last 168 hours CBC:  Recent Labs Lab 08/01/14 1417  WBC 8.7  NEUTROABS 6.4  HGB 10.7*  HCT 31.1*  MCV 85.2  PLT 236   Cardiac Enzymes:  Recent Labs Lab 08/01/14 2214 08/02/14 0718  TROPONINI <0.30 <0.30   BNP (last 3 results) No results found for this basename: PROBNP,  in the last 8760 hours CBG: No results found for this basename: GLUCAP,  in the last 168 hours  No results found for this or any previous visit (from the past 240 hour(s)).   Studies: Ct Abdomen Pelvis W Contrast  08/01/2014   CLINICAL DATA:  Urinary incontinence for several weeks may urinary frequency, abdomen mass  EXAM: CT ABDOMEN AND PELVIS WITH CONTRAST  TECHNIQUE: Multidetector CT imaging of the abdomen and pelvis was performed using the standard protocol following bolus administration of intravenous contrast.  CONTRAST:  149mL OMNIPAQUE IOHEXOL 300 MG/ML  SOLN  COMPARISON:  None.  FINDINGS: Discoid atelectasis left lung base. 2 mm nodular opacity right lung base, pleural-based, most likely a tiny focus of peripheral atelectasis.  4 mm low-attenuation lesion posterior right lobe of liver image 21, too small characterize. Liver otherwise normal. Gallbladder and spleen normal. Pancreas normal. Left adrenal gland normal. 8 mm right adrenal nodule.  Calcified aorta without dilatation. Severe bilateral  hydronephrosis. 4.3 cm cystic lesion lower pole right kidney with thin stripe of hyperattenuation in the posterior ram. There are few tiny low-attenuation lesions in the midpole left kidney which are too small characterize.  No ascites. No significant adenopathy in the abdomen or pelvis. No acute musculoskeletal findings or suspicious focal osseous findings.  Markedly bladder distension. Bladder is distended to 16 x 15 x 15 cm. Prostate is enlarged to a diameter 6.3 x 5.9 cm.  IMPRESSION: 1. Pronounced bladder distension causing severe hydronephrosis. There is enlargement of the prostate. Correlate with PSA levels, with differential diagnostic possibilities including prostate hypertrophy and carcinoma. 2. Multiple nonacute findings including tiny liver lesion that is too small to characterize in which may be a cyst and a tiny adrenal nodule on the right which does not require further workup based on small 5. Right renal lesion may represent a cyst with some dense material layering dependently or thin focus of calcification in the Re and. No precontrast images available on this study and therefore min enhancement not excluded. Consider renal ultrasound or renal protocol CT to characterize further. Also tiny left renal lesions too small to characterize but which may represent cysts.   Electronically Signed   By: Skipper Cliche M.D.   On: 08/01/2014 17:13    Scheduled Meds: . amLODipine  10 mg Oral Daily  . enoxaparin (LOVENOX) injection  30 mg Subcutaneous Q24H   Continuous Infusions: . sodium chloride      Active Problems:   Renal failure   HTN (hypertension), malignant   Hydronephrosis   Enlarged prostate   Anemia    Time spent: [facility] Pager [phone]. If 7PM-7AM, please contact night-coverage at [email or web address], password [facility] 08/02/2014, 8:36 AM  LOS: 1 day

## 2014-08-02 NOTE — ED Provider Notes (Signed)
Medical screening examination/treatment/procedure(s) were conducted as a shared visit with non-physician practitioner(s) and myself.  I personally evaluated the patient during the encounter.   EKG Interpretation   Date/Time:  Monday August 01 2014 13:56:07 EDT Ventricular Rate:  59 PR Interval:  304 QRS Duration: 100 QT Interval:  433 QTC Calculation: 429 R Axis:   73 Text Interpretation:  Sinus rhythm Prolonged PR interval Baseline wander  in lead(s) V3 No previous ECGs available Confirmed by Wyvonnia Dusky  MD, [place]  [phone]) on 08/01/2014 4:55:58 PM       Leota Jacobsen, MD 08/02/14 [phone]

## 2014-08-02 NOTE — Progress Notes (Signed)
Utilization review completed.  

## 2014-08-02 NOTE — ED Notes (Signed)
Attempted to call report

## 2014-08-02 NOTE — ED Notes (Signed)
Called vascular lab to make them aware pt is holding in ed and has a 2 d echo ordered

## 2014-08-02 NOTE — Progress Notes (Signed)
Patient ID: Austin Nicholson, male   DOB: 12/14/1927, 80 y.o.   MRN: [id number]  Pt without complaints. "Feels better". He's been out of bed.    Filed Vitals:   08/02/14 1442  BP:   Pulse:   Temp: 97.9 F (36.6 C)  Resp:     Intake/Output Summary (Last 24 hours) at 08/02/14 1458 Last data filed at 08/02/14 1437  Gross per 24 hour  Intake 2608.33 ml  Output   5950 ml  Net -3341.67 ml    PE: NAD GU: urine red, no clots DRE: large prostate, hard and indurated with large nodule concerning for extracapsular extension of prostate cancer.    BMET    Component Value Date/Time   NA 141 08/02/2014 1030   K 4.8 08/02/2014 1030   CL 107 08/02/2014 1030   CO2 20 08/02/2014 1030   GLUCOSE 125* 08/02/2014 1030   BUN 50* 08/02/2014 1030   CREATININE 4.60* 08/02/2014 1030   CALCIUM 8.0* 08/02/2014 1030   GFRNONAA 11* 08/02/2014 1030   GFRAA 12* 08/02/2014 1030    PSA 300  A/P: 1) ARF, bilateral hydronephrosis - Cr improving, UOP remains good. I suspected this was from bladder outlet obstruction but with high PSA, abnl DRE he likely has locally advanced prostate cancer obstructing the trigone/UO's as well. Continue to follow Cr and UOP.   2) gross hematuria - likely from hemorrhagic cystitis from bladder overdistention and/or from prostate/prostate cancer bleeding as bleeding started after foley placed. It is not very heavy and not interfering with foley draining, so I would continue to monitor.  3) elevated PSA - I was expecting PSA to be elevated with large prostate and retention but 300 is very high. With his DRE (hard and indurated with large nodule concerning for extracapsular extension of prostate cancer) I suspect he has prostate cancer. I discussed the PSA and DRE findings with the patient and that he may have prostate cancer. It may be only be locally advanced as there was no sign of bone mets or LAD on CT. He will need prostate biopsy in outpatient.  4) retention s/p foley

## 2014-08-03 ENCOUNTER — Inpatient Hospital Stay (HOSPITAL_COMMUNITY): Payer: Medicare Other

## 2014-08-03 LAB — BASIC METABOLIC PANEL
ANION GAP: 14 (ref 5–15)
BUN: 51 mg/dL — ABNORMAL HIGH (ref 6–23)
CO2: 22 meq/L (ref 19–32)
Calcium: 8.3 mg/dL — ABNORMAL LOW (ref 8.4–10.5)
Chloride: 109 mEq/L (ref 96–112)
Creatinine, Ser: 4.88 mg/dL — ABNORMAL HIGH (ref 0.50–1.35)
GFR calc Af Amer: 11 mL/min — ABNORMAL LOW (ref >90)
GFR calc non Af Amer: 10 mL/min — ABNORMAL LOW (ref >90)
Glucose, Bld: 105 mg/dL — ABNORMAL HIGH (ref 70–99)
Potassium: 4.6 mEq/L (ref 3.7–5.3)
Sodium: 145 mEq/L (ref 137–147)

## 2014-08-03 LAB — URINE CULTURE: Colony Count: 7000

## 2014-08-03 LAB — PHOSPHORUS: Phosphorus: 4.1 mg/dL (ref 2.3–4.6)

## 2014-08-03 LAB — MAGNESIUM: MAGNESIUM: 2.2 mg/dL (ref 1.5–2.5)

## 2014-08-03 MED ORDER — SODIUM CHLORIDE 0.9 % IV SOLN
INTRAVENOUS | Status: AC
  Administered 2014-08-03 – 2014-08-04 (×3): via INTRAVENOUS

## 2014-08-03 MED ORDER — HEPARIN SODIUM (PORCINE) 5000 UNIT/ML IJ SOLN
5000.0000 [IU] | Freq: Three times a day (TID) | INTRAMUSCULAR | Status: DC
  Administered 2014-08-04 – 2014-08-05 (×5): 5000 [IU] via SUBCUTANEOUS
  Filled 2014-08-03 (×6): qty 1

## 2014-08-03 MED ORDER — HYDROCORTISONE 2.5 % RE CREA
TOPICAL_CREAM | RECTAL | Status: DC | PRN
  Filled 2014-08-03: qty 28.35

## 2014-08-03 NOTE — Progress Notes (Signed)
Upon entering room pt initially refused care for the rest of the shift. After explaining to the patient the need to frequently assess him, he did allow me to complete a brief physical assessment and obtain a set of vital signs. Pt was adamant that I not reenter the room until 6am. Will continue to monitor and assess as pt allows. Will convey to on-call MD if condition changes and notification is warranted.

## 2014-08-03 NOTE — Progress Notes (Signed)
Report called to [facility], pt transferring to [phone] via w/c with belongings. Notified friend mrs Pace of new room number.

## 2014-08-03 NOTE — Progress Notes (Signed)
Patient Demographics  Austin Nicholson, is a 80 y.o. male, DOB - 09-07-1928, ERD:[id number]  Admit date - 08/01/2014   Admitting Physician Sheila Oats, MD  Outpatient Primary MD for the patient is No PCP Per Patient  LOS - 2   Chief Complaint  Patient presents with  . Urinary Incontinence        Subjective:   Jolly Bleicher today has, No headache, No chest pain, No abdominal pain - No Nausea, No new weakness tingling or numbness, No Cough - SOB.   Assessment & Plan    1. Bilateral hydronephrosis with renal failure. Secondary to enlarged prostate, question prostate cancer. He status post Foley catheter placement, creatinine has not improved as expected. Urology is following. Will order repeat renal ultrasound along with BMP. May require nephrostomy tube placement. Baseline creatinine not available in the system. Of note patient was on no medications prior to hospitalization.   2.HTN - on Norvasc. Continue to monitor.    3. Hematuria. Improving. Monitor.    4. Anemia of chronic disease. Anemia panel stable. We'll monitor     Code Status: Full  Family Communication: None present  Disposition Plan: Home   Procedures CT of the pelvis, Foley catheter placement, renal ultrasound   Consults Urology   Medications  Scheduled Meds: . amLODipine  10 mg Oral Daily  . enoxaparin (LOVENOX) injection  30 mg Subcutaneous Q24H  . pneumococcal 23 valent vaccine  0.5 mL Intramuscular Tomorrow-1000   Continuous Infusions: . sodium chloride     PRN Meds:.acetaminophen, acetaminophen, hydrALAZINE, ondansetron (ZOFRAN) IV  DVT Prophylaxis    Lovenox(switched to heparin) and SCDs    Lab Results  Component Value Date   PLT 216 08/02/2014    Antibiotics    Anti-infectives   None            Objective:   Filed Vitals:   08/03/14 0500 08/03/14 0610 08/03/14 0806 08/03/14 0843  BP:  171/85  166/86  Pulse:  74  83  Temp:  98.3 F (36.8 C) 98.7 F (37.1 C)   TempSrc:  Oral Oral   Resp:  17  13  Height:      Weight: 74 kg (163 lb 2.3 oz)     SpO2:  100%  91%    Wt Readings from Last 3 Encounters:  08/03/14 74 kg (163 lb 2.3 oz)     Intake/Output Summary (Last 24 hours) at 08/03/14 1221 Last data filed at 08/03/14 0900  Gross per 24 hour  Intake 2233.33 ml  Output   2656 ml  Net -422.67 ml     Physical Exam  Awake Alert, Oriented X 3, No new F.N deficits, Normal affect Foster.AT,PERRAL Supple Neck,No JVD, No cervical lymphadenopathy appriciated.  Symmetrical Chest wall movement, Good air movement bilaterally, CTAB RRR,No Gallops,Rubs or new Murmurs, No Parasternal Heave +ve B.Sounds, Abd Soft, No tenderness, No organomegaly appriciated, No rebound - guarding or rigidity. No Cyanosis, Clubbing or edema, No new Rash or bruise   Foley in place with red-colored urine   Data Review   Micro Results Recent Results (from the past 240 hour(s))  URINE CULTURE     Status: None   Collection Time    08/01/14 12:03 PM  Result Value Ref Range Status   Specimen Description URINE, RANDOM   Final   Special Requests ADDED AT 1805 ON [id number]   Final   Culture  Setup Time     Final   Value: 08/01/2014 19:17     Performed at [facility]     Final   Value: 7,000 COLONIES/ML     Performed at [facility]   Culture     Final   Value: INSIGNIFICANT GROWTH     Performed at [facility]   Report Status 08/03/2014 FINAL   Final  MRSA PCR SCREENING     Status: None   Collection Time    08/02/14 11:46 AM      Result Value Ref Range Status   MRSA by PCR NEGATIVE  NEGATIVE Final   Comment:            The GeneXpert MRSA Assay (FDA     approved for NASAL specimens     only), is one component of a     comprehensive MRSA  colonization     surveillance program. It is not     intended to diagnose MRSA     infection nor to guide or     monitor treatment for     MRSA infections.    Radiology Reports Ct Abdomen Pelvis W Contrast  08/01/2014   CLINICAL DATA:  Urinary incontinence for several weeks may urinary frequency, abdomen mass  EXAM: CT ABDOMEN AND PELVIS WITH CONTRAST  TECHNIQUE: Multidetector CT imaging of the abdomen and pelvis was performed using the standard protocol following bolus administration of intravenous contrast.  CONTRAST:  152mL OMNIPAQUE IOHEXOL 300 MG/ML  SOLN  COMPARISON:  None.  FINDINGS: Discoid atelectasis left lung base. 2 mm nodular opacity right lung base, pleural-based, most likely a tiny focus of peripheral atelectasis.  4 mm low-attenuation lesion posterior right lobe of liver image 21, too small characterize. Liver otherwise normal. Gallbladder and spleen normal. Pancreas normal. Left adrenal gland normal. 8 mm right adrenal nodule.  Calcified aorta without dilatation. Severe bilateral hydronephrosis. 4.3 cm cystic lesion lower pole right kidney with thin stripe of hyperattenuation in the posterior ram. There are few tiny low-attenuation lesions in the midpole left kidney which are too small characterize.  No ascites. No significant adenopathy in the abdomen or pelvis. No acute musculoskeletal findings or suspicious focal osseous findings.  Markedly bladder distension. Bladder is distended to 16 x 15 x 15 cm. Prostate is enlarged to a diameter 6.3 x 5.9 cm.  IMPRESSION: 1. Pronounced bladder distension causing severe hydronephrosis. There is enlargement of the prostate. Correlate with PSA levels, with differential diagnostic possibilities including prostate hypertrophy and carcinoma. 2. Multiple nonacute findings including tiny liver lesion that is too small to characterize in which may be a cyst and a tiny adrenal nodule on the right which does not require further workup based on small 5. Right  renal lesion may represent a cyst with some dense material layering dependently or thin focus of calcification in the Re and. No precontrast images available on this study and therefore min enhancement not excluded. Consider renal ultrasound or renal protocol CT to characterize further. Also tiny left renal lesions too small to characterize but which may represent cysts.   Electronically Signed   By: Skipper Cliche M.D.   On: 08/01/2014 17:13    CBC  Recent Labs Lab 08/01/14 1417 08/02/14 1030  WBC 8.7 9.8  HGB  10.7* 11.2*  HCT 31.1* 33.7*  PLT 236 216  MCV 85.2 86.4  MCH 29.3 28.7  MCHC 34.4 33.2  RDW 13.0 13.2  LYMPHSABS 1.6  --   MONOABS 0.4  --   EOSABS 0.2  --   BASOSABS 0.1  --     Chemistries   Recent Labs Lab 08/01/14 1417 08/01/14 2214 08/02/14 1030 08/02/14 1900 08/02/14 2217 08/03/14 0843  NA 144 143 141 140  --  145  K 4.8 4.2 4.8 3.9  --  4.6  CL 105 105 107 103  --  109  CO2 24 22 20 23   --  22  GLUCOSE 96 86 125* 123*  --  105*  BUN 53* 52* 50* 52*  --  51*  CREATININE 5.14* 5.08* 4.60* 4.70*  --  4.88*  CALCIUM 9.1 8.7 8.0* 8.3*  --  8.3*  MG  --  2.4 2.2  --  2.1 2.2  AST 12  --   --   --   --   --   ALT 7  --   --   --   --   --   ALKPHOS 46  --   --   --   --   --   BILITOT 0.4  --   --   --   --   --    ------------------------------------------------------------------------------------------------------------------ estimated creatinine clearance is 10.2 ml/min (by C-G formula based on Cr of 4.88). ------------------------------------------------------------------------------------------------------------------ No results found for this basename: HGBA1C,  in the last 72 hours ------------------------------------------------------------------------------------------------------------------ No results found for this basename: CHOL, HDL, LDLCALC, TRIG, CHOLHDL, LDLDIRECT,  in the last 72  hours ------------------------------------------------------------------------------------------------------------------ No results found for this basename: TSH, T4TOTAL, FREET3, T3FREE, THYROIDAB,  in the last 72 hours ------------------------------------------------------------------------------------------------------------------  Recent Labs  08/01/14 2214  VITAMINB12 203*  FOLATE 18.1  FERRITIN 440*  TIBC 191*  IRON 58  RETICCTPCT 0.4    Coagulation profile No results found for this basename: INR, PROTIME,  in the last 168 hours  No results found for this basename: DDIMER,  in the last 72 hours  Cardiac Enzymes  Recent Labs Lab 08/01/14 2214 08/02/14 0718 08/02/14 1030  TROPONINI <0.30 <0.30 <0.30   ------------------------------------------------------------------------------------------------------------------ No components found with this basename: POCBNP,      Time Spent in minutes  35   SINGH,PRASHANT K M.D on 08/03/2014 at 12:21 PM  Between 7am to 7pm - Pager - [phone]  After 7pm go to [email or web address] - password [email or web address]  And look for the night coverage person covering for me after hours  [facility] Office  [phone]   **Disclaimer: This note may have been dictated with voice recognition software. Similar sounding words can inadvertently be transcribed and this note may contain transcription errors which may not have been corrected upon publication of note.**

## 2014-08-03 NOTE — Progress Notes (Signed)
Patient ID: Austin Nicholson, male   DOB: 05-Aug-1928, 80 y.o.   MRN: [id number]   Filed Vitals:   08/03/14 1547  BP: 159/69  Pulse:   Temp: 98.2 F (36.8 C)  Resp: 14    Intake/Output Summary (Last 24 hours) at 08/03/14 1756 Last data filed at 08/03/14 1600  Gross per 24 hour  Intake 2902.5 ml  Output   3031 ml  Net -128.5 ml    PE: NAD Lying in bed Urine has cleared - light red / pink in bag ; clear in tubing.   BMET    Component Value Date/Time   NA 145 08/03/2014 0843   K 4.6 08/03/2014 0843   CL 109 08/03/2014 0843   CO2 22 08/03/2014 0843   GLUCOSE 105* 08/03/2014 0843   BUN 51* 08/03/2014 0843   CREATININE 4.88* 08/03/2014 0843   CALCIUM 8.3* 08/03/2014 0843   GFRNONAA 10* 08/03/2014 0843   GFRAA 11* 08/03/2014 0843    Renal U/s - Shows some continued hydronephrosis right greater than left.  The bladder is decompressed and the Foley appears to be in good position in the bladder.  1) ARF, bilateral hydronephrosis - I'm surprised his creatinine has not improved. He's made about 9 L of urine over past 48 hrs and there is another 800 ml in his foley bag now. I suppose this could he his new baseline. Renal U/S shows some persistent hydro, but this could be from long-standing obstruction which means it could persist even if there is no obstruction. If his creatinine continues to remain elevated, I'll discuss him with nephrology to get their opinion. I'm not sure Nephrostomy tubes would help. I discussed the nature risk and benefits of nephrostomy tubes with the patient and alternative such as ureteral stenting.  However there is a good chance I wouldn't be able to find the ureteral orifices if he has locally advanced prostate cancer and there is the potential that stents could fail and he'll need nephrostomy tubes anyway. The patient wants to avoid Nx tubes.  2) gross hematuria - resolved.  3) elevated PSA -  He will need prostate biopsy in outpatient.  4) retention s/p foley

## 2014-08-03 NOTE — Progress Notes (Signed)
Patient ID: Austin Nicholson, male   DOB: 1928-11-12, 80 y.o.   MRN: [id number]  UOP remains good, but bun, Cr have not recovered. Yesterday evening I discussed with the patientI was concerned about a locally advanced prostate cancer and we talked about he may need nephrostomy tubes.  He asked if they were "cumbersome".  I discussed how they were placed, how they function and their purpose. We will need to consider nephrostomy tubes if he continues to have hydronephrosis and renal failure.   I ordered a renal ultrasound to assess for continued hydronephrosis despite bladder drainage.  I also asked them to scan his bladder to ensure the balloon is in the bladder.     Intake/Output Summary (Last 24 hours) at 08/03/14 0818 Last data filed at 08/03/14 0808  Gross per 24 hour  Intake 4641.66 ml  Output   3231 ml  Net 1410.66 ml   BMET    Component Value Date/Time   NA 140 08/02/2014 1900   K 3.9 08/02/2014 1900   CL 103 08/02/2014 1900   CO2 23 08/02/2014 1900   GLUCOSE 123* 08/02/2014 1900   BUN 52* 08/02/2014 1900   CREATININE 4.70* 08/02/2014 1900   CALCIUM 8.3* 08/02/2014 1900   GFRNONAA 10* 08/02/2014 1900   GFRAA 12* 08/02/2014 1900

## 2014-08-04 LAB — BASIC METABOLIC PANEL
ANION GAP: 15 (ref 5–15)
BUN: 42 mg/dL — ABNORMAL HIGH (ref 6–23)
CALCIUM: 8 mg/dL — AB (ref 8.4–10.5)
CO2: 21 mEq/L (ref 19–32)
Chloride: 108 mEq/L (ref 96–112)
Creatinine, Ser: 4.13 mg/dL — ABNORMAL HIGH (ref 0.50–1.35)
GFR calc Af Amer: 14 mL/min — ABNORMAL LOW (ref >90)
GFR calc non Af Amer: 12 mL/min — ABNORMAL LOW (ref >90)
GLUCOSE: 93 mg/dL (ref 70–99)
POTASSIUM: 4.3 meq/L (ref 3.7–5.3)
SODIUM: 144 meq/L (ref 137–147)

## 2014-08-04 LAB — SODIUM, URINE, RANDOM: Sodium, Ur: 88 mEq/L

## 2014-08-04 LAB — CBC
HCT: 31 % — ABNORMAL LOW (ref 39.0–52.0)
Hemoglobin: 10.6 g/dL — ABNORMAL LOW (ref 13.0–17.0)
MCH: 29.5 pg (ref 26.0–34.0)
MCHC: 34.2 g/dL (ref 30.0–36.0)
MCV: 86.4 fL (ref 78.0–100.0)
Platelets: 214 10*3/uL (ref 150–400)
RBC: 3.59 MIL/uL — AB (ref 4.22–5.81)
RDW: 13.3 % (ref 11.5–15.5)
WBC: 9.5 10*3/uL (ref 4.0–10.5)

## 2014-08-04 LAB — CREATININE, URINE, RANDOM: CREATININE, URINE: 51.3 mg/dL

## 2014-08-04 NOTE — Care Management Note (Addendum)
    Page 1 of 2   08/05/2014     4:33:12 PM CARE MANAGEMENT NOTE 08/05/2014  Patient:  Austin Nicholson,Austin Nicholson   Account Number:  [id number]  Date Initiated:  08/03/2014  Documentation initiated by:  Marvetta Gibbons  Subjective/Objective Assessment:   PT admitted with RF,     Action/Plan:   PTA pt staying in car per report from friend- has a home- but has lost keys- per friend pt's home not safe - son from [place] in on Friday- son's name and contact placed in chart- PT eval pending- NCM and CSW to follow for d/c needs   Anticipated DC Date:  08/05/2014   Anticipated DC Plan:  [place]  In-house referral  Clinical Social Worker      DC Planning Services  CM consult  Medication Assistance      [facility] Choice  HOME HEALTH   Choice offered to / List presented to:  C-1 Patient        [place] arranged  HH-2 PT      [place].   Status of service:  Completed, signed off Medicare Important Message given?  YES (If response is "NO", the following Medicare IM given date fields will be blank) Date Medicare IM given:  08/04/2014 Medicare IM given by:  Tomi Bamberger Date Additional Medicare IM given:   Additional Medicare IM given by:    Discharge Disposition:  [place]  Per UR Regulation:  Reviewed for med. 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 [place] [address], phone is [phone].  NCM assisted patient with [place] letter for medications.  Patient does not have Medicare A or Medicare Part D.  Patient has f/u apt at [facility] clinic , but will be cheaper to get meds with [facility]. Patient only wants hhpt, he is staying with someone else, not his relatives.  08/04/14 [place], BSN [phone] patient has  medicare but does not have Part D for medications.  Patient has hospital f/u apt at [facility] clinic on 8/31 at [place].  Patient will need ast with medications at dc.

## 2014-08-04 NOTE — Consult Note (Signed)
Reason for Consult: Acute renal failure Referring Physician: Festus Aloe M.D. (urology)   HPI: 80 year old Caucasian man with past medical history significant for basal cell carcinoma status post excision who has not seen his regular doctor for the past 10 years and does not have baseline labs in the recent past for comparison. Presented to the emergency room earlier in the week with a three-week history of urinary incontinence without associated dysuria, urgency, frequency, hematuria or flank pain. Denies any associated nausea, vomiting or dysgeusia and reports that appetite and energy levels remain fine. It appears that he was tolerating the symptoms of urinary incontinence but his friends around him or being bothered by the odor associated with this. In the emergency room, he underwent a CT scan of the abdomen and pelvis with contrast that showed pronounced bladder distention with severe hydronephrosis and enlargement of the prostate. Consequently underwent Foley catheter insertion with 2.3L urine output. He is continued to make good amounts of urine but his creatinine did not drop as rapidly as it usually does in patients with obstructive uropathy. On admission, he was noted to be in acute renal failure with a creatinine of 5.14 that has improved now to 4.1 with intravenous fluids. He states that he currently feels well with occasional suprapubic discomfort. He has been seen by urology and concern is raised regarding his elevated PSA.  Past Medical History  Diagnosis Date  . Cancer     Past Surgical History  Procedure Laterality Date  . Basal cell carcinoma excision      No family history on file.  Social History:  reports that he has never smoked. He does not have any smokeless tobacco history on file. He reports that he does not drink alcohol. His drug history is not on file.  Allergies: No Known Allergies  Medications:  Scheduled: . amLODipine  10 mg Oral Daily  . heparin  subcutaneous  5,000 Units Subcutaneous 3 times per day  . pneumococcal 23 valent vaccine  0.5 mL Intramuscular Tomorrow-1000    Results for orders placed during the hospital encounter of 08/01/14 (from the past 48 hour(s))  BASIC METABOLIC PANEL     Status: Abnormal   Collection Time    08/02/14  7:00 PM      Result Value Ref Range   Sodium 140  137 - 147 mEq/L   Potassium 3.9  3.7 - 5.3 mEq/L   Comment: DELTA CHECK NOTED   Chloride 103  96 - 112 mEq/L   CO2 23  19 - 32 mEq/L   Glucose, Bld 123 (*) 70 - 99 mg/dL   BUN 52 (*) 6 - 23 mg/dL   Creatinine, Ser 4.70 (*) 0.50 - 1.35 mg/dL   Calcium 8.3 (*) 8.4 - 10.5 mg/dL   GFR calc non Af Amer 10 (*) >90 mL/min   GFR calc Af Amer 12 (*) >90 mL/min   Comment: (NOTE)     The eGFR has been calculated using the CKD EPI equation.     This calculation has not been validated in all clinical situations.     eGFR's persistently <90 mL/min signify possible Chronic Kidney     Disease.   Anion gap 14  5 - 15  GLUCOSE, CAPILLARY     Status: Abnormal   Collection Time    08/02/14  8:11 PM      Result Value Ref Range   Glucose-Capillary 144 (*) 70 - 99 mg/dL  MAGNESIUM     Status: None  Collection Time    08/02/14 10:17 PM      Result Value Ref Range   Magnesium 2.1  1.5 - 2.5 mg/dL  PHOSPHORUS     Status: None   Collection Time    08/02/14 10:17 PM      Result Value Ref Range   Phosphorus 4.0  2.3 - 4.6 mg/dL  MAGNESIUM     Status: None   Collection Time    08/03/14  8:43 AM      Result Value Ref Range   Magnesium 2.2  1.5 - 2.5 mg/dL  BASIC METABOLIC PANEL     Status: Abnormal   Collection Time    08/03/14  8:43 AM      Result Value Ref Range   Sodium 145  137 - 147 mEq/L   Potassium 4.6  3.7 - 5.3 mEq/L   Chloride 109  96 - 112 mEq/L   CO2 22  19 - 32 mEq/L   Glucose, Bld 105 (*) 70 - 99 mg/dL   BUN 51 (*) 6 - 23 mg/dL   Creatinine, Ser 4.88 (*) 0.50 - 1.35 mg/dL   Calcium 8.3 (*) 8.4 - 10.5 mg/dL   GFR calc non Af Amer 10  (*) >90 mL/min   GFR calc Af Amer 11 (*) >90 mL/min   Comment: (NOTE)     The eGFR has been calculated using the CKD EPI equation.     This calculation has not been validated in all clinical situations.     eGFR's persistently <90 mL/min signify possible Chronic Kidney     Disease.   Anion gap 14  5 - 15  PHOSPHORUS     Status: None   Collection Time    08/03/14  8:43 AM      Result Value Ref Range   Phosphorus 4.1  2.3 - 4.6 mg/dL  BASIC METABOLIC PANEL     Status: Abnormal   Collection Time    08/04/14  6:50 AM      Result Value Ref Range   Sodium 144  137 - 147 mEq/L   Potassium 4.3  3.7 - 5.3 mEq/L   Chloride 108  96 - 112 mEq/L   CO2 21  19 - 32 mEq/L   Glucose, Bld 93  70 - 99 mg/dL   BUN 42 (*) 6 - 23 mg/dL   Creatinine, Ser 4.13 (*) 0.50 - 1.35 mg/dL   Calcium 8.0 (*) 8.4 - 10.5 mg/dL   GFR calc non Af Amer 12 (*) >90 mL/min   GFR calc Af Amer 14 (*) >90 mL/min   Comment: (NOTE)     The eGFR has been calculated using the CKD EPI equation.     This calculation has not been validated in all clinical situations.     eGFR's persistently <90 mL/min signify possible Chronic Kidney     Disease.   Anion gap 15  5 - 15  CBC     Status: Abnormal   Collection Time    08/04/14  6:50 AM      Result Value Ref Range   WBC 9.5  4.0 - 10.5 K/uL   RBC 3.59 (*) 4.22 - 5.81 MIL/uL   Hemoglobin 10.6 (*) 13.0 - 17.0 g/dL   HCT 31.0 (*) 39.0 - 52.0 %   MCV 86.4  78.0 - 100.0 fL   MCH 29.5  26.0 - 34.0 pg   MCHC 34.2  30.0 - 36.0 g/dL   RDW 13.3  11.5 -  15.5 %   Platelets 214  150 - 400 K/uL  SODIUM, URINE, RANDOM     Status: None   Collection Time    08/04/14 10:52 AM      Result Value Ref Range   Sodium, Ur 88    CREATININE, URINE, RANDOM     Status: None   Collection Time    08/04/14 10:52 AM      Result Value Ref Range   Creatinine, Urine 51.30      US Renal  08/03/2014   CLINICAL DATA:  Urinary incontinence. Hydronephrosis on prior CT scan.  EXAM: RENAL/URINARY TRACT  ULTRASOUND COMPLETE  COMPARISON:  CT abdomen and pelvis 08/01/2014.  FINDINGS: Right Kidney:  Length: 11.9 cm. Moderate right hydronephrosis does not appear markedly changed compared to the prior examination. 3.8 cm lower pole cyst is identified as on the prior study.  Left Kidney:  Length: 10.2 cm. Moderate left hydronephrosis does not appear markedly changed. The left kidney is otherwise unremarkable.  Bladder:  Completely decompressed with a Foley catheter in place.  IMPRESSION: Moderate bilateral hydronephrosis does not appear markedly changed compared to the prior study.  The urinary bladder is completely decompressed with a Foley catheter in place.   Electronically Signed   By: Inge Rise M.D.   On: 08/03/2014 15:12    Review of Systems  Constitutional: Negative for fever, chills, weight loss and malaise/fatigue.  HENT: Negative for congestion, hearing loss, nosebleeds and tinnitus.   Eyes: Negative.   Respiratory: Negative.   Cardiovascular: Negative.   Gastrointestinal: Negative.   Musculoskeletal: Negative.   Neurological: Negative.  Negative for headaches.  Endo/Heme/Allergies: Negative.   Psychiatric/Behavioral: Negative.    Blood pressure 154/78, pulse 70, temperature 98.4 F (36.9 C), temperature source Oral, resp. rate 18, height 5' 6"  (1.676 m), weight 74.481 kg (164 lb 3.2 oz), SpO2 98.00%. Physical Exam  Nursing note and vitals reviewed. Constitutional: He is oriented to person, place, and time. He appears well-developed and well-nourished. No distress.  HENT:  Head: Normocephalic and atraumatic.  Nose: Nose normal.  Eyes: EOM are normal. Pupils are equal, round, and reactive to light. No scleral icterus.  Neck: Normal range of motion. Neck supple. No JVD present. No tracheal deviation present. No thyromegaly present.  Cardiovascular: Normal rate, regular rhythm and normal heart sounds.   No murmur heard. Respiratory: Effort normal and breath sounds normal. No  respiratory distress. He has no wheezes. He has no rales.  GI: Soft. Bowel sounds are normal. He exhibits no distension. There is tenderness. There is no rebound and no guarding.  Mild suprapubic  Musculoskeletal: Normal range of motion. He exhibits no edema and no tenderness.  Neurological: He is alert and oriented to person, place, and time. No cranial nerve deficit. Coordination normal.  Skin: Skin is warm and dry. No rash noted. No erythema.  Psychiatric: He has a normal mood and affect. His behavior is normal.    Assessment/Plan: 1. Acute renal failure: Appears to be bi-factorial from obstructive uropathy as well as contrast-induced nephropathy. Fortunately, he is nonoliguric and renal function is improving albeit slowly. We'll continue to follow him closely for any emergent needs. Urine electrolytes have been requested. No acute need for dialysis noted. Continue to avoid contrast and nonsteroidal anti-inflammatory drugs to promote renal recovery. We'll adjust intravenous fluids in order to get him fluid even. 2. Bladder outlet obstruction/enlarged prostate: Ongoing close evaluation and management by urology 3. Anemia: Mild, continue to monitor for overt losses (currently  appears to be partly from hematuria) iron levels appear to be adequate. 4. Hypertension: Blood pressure slightly elevated, he is now on amlodipine and when necessary hydralazine.  Camylle Whicker K. 08/04/2014, 11:50 AM

## 2014-08-04 NOTE — Progress Notes (Signed)
Patient ID: Austin Bonfield, male   DOB: 04/22/1928, 80 y.o.   MRN: [id number]   Kidney function improved some, UOP has gone back up. I spoke with Dr. Posey Pronto from [facility] who will see the patient.

## 2014-08-04 NOTE — Evaluation (Signed)
Physical Therapy Evaluation Patient Details Name: Austin Nicholson MRN: [id number] DOB: [birth year] Today's Date: 08/04/2014   History of Present Illness  Patient is a 80 y/o male admitted for urinary incontinence, diagnosed with Bil hydronephrosis secondary to BPH and ARF. Pt reports that for the past several weeks he has urinary incontinence requiring use of adult diapers and also noted that his urine output is decreased. He reports that a couple of weeks ago he noted a 'lump'in his abdomen. Friends took him to their PCP and it was recommended he come to the ED however pt did not as he thought "lump" got smaller. In ED, creatinine 5.14 with a BUN of 53 . Ct abdomen and pelvis-pronounced bladder distention causing severe hydronephrosis with enlargement of the prostate.-   Clinical Impression  Patient presents with functional limitations due to deficits listed in PT problem list (see below). Pt with generalized weakness and poor safety awareness putting pt at increased risk for falls. Pt unsteady during gait training. Pt does not seem to have the necessary support to safely return home. Pt would benefit from acute PT and follow up [facility] to improve gait, balance and overall mobility so pt can maximize independence and return to PLOF.    Follow Up Recommendations SNF;Supervision/Assistance - 24 hour    Equipment Recommendations  None recommended by PT (defer to SNF.  )    Recommendations for Other Services       Precautions / Restrictions Precautions Precautions: Fall Restrictions Weight Bearing Restrictions: No      Mobility  Bed Mobility               General bed mobility comments: Received sitting in chair upon arrival.   Transfers Overall transfer level: Needs assistance Equipment used: None Transfers: Sit to/from Stand;Stand Pivot Transfers Sit to Stand: Min guard Stand pivot transfers: Min guard       General transfer comment: Requires Min guard for safety and  directional cues as pt mildly impulsive. Ambulated to bathroom and transferred on/off toilet with grab bar.   Ambulation/Gait Ambulation/Gait assistance: Min assist Ambulation Distance (Feet): 200 Feet (+20' without AD.) Assistive device: Rolling walker (2 wheeled) Gait Pattern/deviations: Step-through pattern;Decreased stride length;Drifts right/left;Trunk flexed Gait velocity: Decreased   General Gait Details: Unsteady gait. VC for RW management as pt with difficulty negotiating walker especially during turns. Pt getting outside RW causing BLEs to bump into walker legs at times. Pt picking up walker initially during gait.   Stairs            Wheelchair Mobility    Modified Rankin (Stroke Patients Only)       Balance Overall balance assessment: Needs assistance   Sitting balance-Leahy Scale: Fair       Standing balance-Leahy Scale: Fair Standing balance comment: Able to perform dynamic standing at sink reaching outside BoS washing hands without UE support and perform short distance ambulation in room holding onto furniture to maintain balance. Unsteady, requires increased support for longer distances due to balance deficits and poor safety awareness.                             Pertinent Vitals/Pain Pain Assessment: No/denies pain    Home Living Family/patient expects to be discharged to:: Skilled nursing facility                      Prior Function Level of Independence: Independent  Comments: Pt poor historian. 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  Has a son that lives in [place].     Hand Dominance        Extremity/Trunk Assessment   Upper Extremity Assessment: Overall WFL for tasks assessed           Lower Extremity Assessment: Generalized  weakness         Communication   Communication: No difficulties  Cognition Arousal/Alertness: Awake/alert Behavior During Therapy: WFL for tasks assessed/performed Overall Cognitive Status: No family/caregiver present to determine baseline cognitive functioning Area of Impairment: Following commands;Safety/judgement;Problem solving       Following Commands: Follows multi-step commands inconsistently Safety/Judgement: Decreased awareness of safety;Decreased awareness of deficits   Problem Solving: Requires verbal cues;Requires tactile cues General Comments: Easily distracted and perseverates on topics of conversation however not able to directly answer questions asked- home situation.    General Comments      Exercises        Assessment/Plan    PT Assessment Patient needs continued PT services  PT Diagnosis Difficulty walking;Generalized weakness   PT Problem List Decreased strength;Decreased cognition;Decreased activity tolerance;Decreased knowledge of use of DME;Decreased balance;Decreased mobility;Decreased safety awareness;Decreased knowledge of precautions  PT Treatment Interventions DME instruction;Balance training;Gait training;Cognitive remediation;Therapeutic activities;Therapeutic exercise;Functional mobility training;Patient/family education   PT Goals (Current goals can be found in the Care Plan section) Acute Rehab PT Goals Patient Stated Goal: to get home to play at church on saturday PT Goal Formulation: With patient Time For Goal Achievement: 08/18/14 Potential to Achieve Goals: Good    Frequency Min 2X/week   Barriers to discharge Decreased caregiver support Pt reports living alone- however unsure if patient has a home?? or support system as pt's son lives in [place].    Co-evaluation               End of Session Equipment Utilized During Treatment: Gait belt Activity Tolerance: Patient tolerated treatment well Patient left: in chair;with call  bell/phone within reach;with chair alarm set Nurse Communication: Mobility status         Time: 9030-0923 PT Time Calculation (min): 33 min   Charges:   PT Evaluation $Initial PT Evaluation Tier I: 1 Procedure PT Treatments $Gait Training: 8-22 mins   PT G CodesCandy Sledge A 08/04/2014, 11:13 AM [name], PT, DPT [phone]

## 2014-08-04 NOTE — Progress Notes (Addendum)
Patient Demographics  Austin Nicholson, is a 80 y.o. male, DOB - 05/29/1928, MOQ:[id number]  Admit date - 08/01/2014   Admitting Physician Sheila Oats, MD  Outpatient Primary MD for the patient is Redge Gainer, MD  LOS - 3   Chief Complaint  Patient presents with  . Urinary Incontinence        Subjective:   Austin Nicholson today has, No headache, No chest pain, No abdominal pain - No Nausea, No new weakness tingling or numbness, No Cough - SOB.   Assessment & Plan    1. Bilateral hydronephrosis with renal failure. Secondary to enlarged prostate, question prostate cancer. He is status post Foley catheter placement, creatinine has  improved albeit gradually, good urine output, called her PCP office he has had no blood work in the last 10 years. We do not know baseline creatinine. Urology following and they have consulted nephrology as well. Avoid nephrotoxins monitor BMP. Repeat ultrasound shows improvement in hydronephrosis post Foley placement. We'll defer further care to nephrology and urology.    2.HTN - on Norvasc. Continue to monitor.    3. Hematuria. Improving. Monitor.    4. Anemia of chronic disease. Anemia panel stable. We'll monitor     Code Status: Full  Family Communication: Called and updated son over the phone he lives in [place]  Disposition Plan: Home   Procedures CT of the pelvis, Foley catheter placement, renal ultrasound, 2-D echogram showing EF 60% and grade 1 diastolic dysfunction with mild pulmonary hypertension.   Consults Urology   Medications  Scheduled Meds: . amLODipine  10 mg Oral Daily  . heparin subcutaneous  5,000 Units Subcutaneous 3 times per day  . pneumococcal 23 valent vaccine  0.5 mL Intramuscular Tomorrow-1000   Continuous Infusions: .  sodium chloride 125 mL/hr at 08/04/14 1030   PRN Meds:.acetaminophen, acetaminophen, hydrALAZINE, hydrocortisone, ondansetron (ZOFRAN) IV  DVT Prophylaxis    Lovenox(switched to heparin) and SCDs    Lab Results  Component Value Date   PLT 214 08/04/2014    Antibiotics    Anti-infectives   None          Objective:   Filed Vitals:   08/03/14 1547 08/03/14 2216 08/04/14 0318 08/04/14 0410  BP: 159/69 151/71  154/78  Pulse:  86  70  Temp: 98.2 F (36.8 C) 99.1 F (37.3 C)  98.4 F (36.9 C)  TempSrc: Oral Oral  Oral  Resp: 14 18  18   Height:      Weight:   74.481 kg (164 lb 3.2 oz)   SpO2: 98% 100%  98%    Wt Readings from Last 3 Encounters:  08/04/14 74.481 kg (164 lb 3.2 oz)     Intake/Output Summary (Last 24 hours) at 08/04/14 1053 Last data filed at 08/04/14 0600  Gross per 24 hour  Intake  982.5 ml  Output   3250 ml  Net -2267.5 ml     Physical Exam  Awake Alert, Oriented X 3, No new F.N deficits, Normal affect [place].AT,PERRAL Supple Neck,No JVD, No cervical lymphadenopathy appriciated.  Symmetrical Chest wall movement, Good air movement bilaterally, CTAB RRR,No Gallops,Rubs or new Murmurs, No Parasternal Heave +ve B.Sounds, Abd Soft, No tenderness, No organomegaly appriciated, No rebound - guarding or  rigidity. Foley in place with minimally red urine color is improving. No Cyanosis, Clubbing or edema, No new Rash or bruise   Foley in place with red-colored urine   Data Review   Micro Results Recent Results (from the past 240 hour(s))  URINE CULTURE     Status: None   Collection Time    08/01/14 12:03 PM      Result Value Ref Range Status   Specimen Description URINE, RANDOM   Final   Special Requests ADDED AT 1805 ON [id number]   Final   Culture  Setup Time     Final   Value: 08/01/2014 19:17     Performed at [facility]     Final   Value: 7,000 COLONIES/ML     Performed at [facility]   Culture     Final    Value: INSIGNIFICANT GROWTH     Performed at [facility]   Report Status 08/03/2014 FINAL   Final  MRSA PCR SCREENING     Status: None   Collection Time    08/02/14 11:46 AM      Result Value Ref Range Status   MRSA by PCR NEGATIVE  NEGATIVE Final   Comment:            The GeneXpert MRSA Assay (FDA     approved for NASAL specimens     only), is one component of a     comprehensive MRSA colonization     surveillance program. It is not     intended to diagnose MRSA     infection nor to guide or     monitor treatment for     MRSA infections.    Radiology Reports Ct Abdomen Pelvis W Contrast  08/01/2014   CLINICAL DATA:  Urinary incontinence for several weeks may urinary frequency, abdomen mass  EXAM: CT ABDOMEN AND PELVIS WITH CONTRAST  TECHNIQUE: Multidetector CT imaging of the abdomen and pelvis was performed using the standard protocol following bolus administration of intravenous contrast.  CONTRAST:  176mL OMNIPAQUE IOHEXOL 300 MG/ML  SOLN  COMPARISON:  None.  FINDINGS: Discoid atelectasis left lung base. 2 mm nodular opacity right lung base, pleural-based, most likely a tiny focus of peripheral atelectasis.  4 mm low-attenuation lesion posterior right lobe of liver image 21, too small characterize. Liver otherwise normal. Gallbladder and spleen normal. Pancreas normal. Left adrenal gland normal. 8 mm right adrenal nodule.  Calcified aorta without dilatation. Severe bilateral hydronephrosis. 4.3 cm cystic lesion lower pole right kidney with thin stripe of hyperattenuation in the posterior ram. There are few tiny low-attenuation lesions in the midpole left kidney which are too small characterize.  No ascites. No significant adenopathy in the abdomen or pelvis. No acute musculoskeletal findings or suspicious focal osseous findings.  Markedly bladder distension. Bladder is distended to 16 x 15 x 15 cm. Prostate is enlarged to a diameter 6.3 x 5.9 cm.  IMPRESSION: 1. Pronounced bladder  distension causing severe hydronephrosis. There is enlargement of the prostate. Correlate with PSA levels, with differential diagnostic possibilities including prostate hypertrophy and carcinoma. 2. Multiple nonacute findings including tiny liver lesion that is too small to characterize in which may be a cyst and a tiny adrenal nodule on the right which does not require further workup based on small 5. Right renal lesion may represent a cyst with some dense material layering dependently or thin focus of calcification in the Re and. No precontrast images  available on this study and therefore min enhancement not excluded. Consider renal ultrasound or renal protocol CT to characterize further. Also tiny left renal lesions too small to characterize but which may represent cysts.   Electronically Signed   By: Skipper Cliche M.D.   On: 08/01/2014 17:13    CBC  Recent Labs Lab 08/01/14 1417 08/02/14 1030 08/04/14 0650  WBC 8.7 9.8 9.5  HGB 10.7* 11.2* 10.6*  HCT 31.1* 33.7* 31.0*  PLT 236 216 214  MCV 85.2 86.4 86.4  MCH 29.3 28.7 29.5  MCHC 34.4 33.2 34.2  RDW 13.0 13.2 13.3  LYMPHSABS 1.6  --   --   MONOABS 0.4  --   --   EOSABS 0.2  --   --   BASOSABS 0.1  --   --     Chemistries   Recent Labs Lab 08/01/14 1417 08/01/14 2214 08/02/14 1030 08/02/14 1900 08/02/14 2217 08/03/14 0843 08/04/14 0650  NA 144 143 141 140  --  145 144  K 4.8 4.2 4.8 3.9  --  4.6 4.3  CL 105 105 107 103  --  109 108  CO2 24 22 20 23   --  22 21  GLUCOSE 96 86 125* 123*  --  105* 93  BUN 53* 52* 50* 52*  --  51* 42*  CREATININE 5.14* 5.08* 4.60* 4.70*  --  4.88* 4.13*  CALCIUM 9.1 8.7 8.0* 8.3*  --  8.3* 8.0*  MG  --  2.4 2.2  --  2.1 2.2  --   AST 12  --   --   --   --   --   --   ALT 7  --   --   --   --   --   --   ALKPHOS 46  --   --   --   --   --   --   BILITOT 0.4  --   --   --   --   --   --     ------------------------------------------------------------------------------------------------------------------ estimated creatinine clearance is 12 ml/min (by C-G formula based on Cr of 4.13). ------------------------------------------------------------------------------------------------------------------ No results found for this basename: HGBA1C,  in the last 72 hours ------------------------------------------------------------------------------------------------------------------ No results found for this basename: CHOL, HDL, LDLCALC, TRIG, CHOLHDL, LDLDIRECT,  in the last 72 hours ------------------------------------------------------------------------------------------------------------------ No results found for this basename: TSH, T4TOTAL, FREET3, T3FREE, THYROIDAB,  in the last 72 hours ------------------------------------------------------------------------------------------------------------------  Recent Labs  08/01/14 2214  VITAMINB12 203*  FOLATE 18.1  FERRITIN 440*  TIBC 191*  IRON 58  RETICCTPCT 0.4    Coagulation profile No results found for this basename: INR, PROTIME,  in the last 168 hours  No results found for this basename: DDIMER,  in the last 72 hours  Cardiac Enzymes  Recent Labs Lab 08/01/14 2214 08/02/14 0718 08/02/14 1030  TROPONINI <0.30 <0.30 <0.30   ------------------------------------------------------------------------------------------------------------------ No components found with this basename: POCBNP,      Time Spent in minutes  35   Kelleen Stolze K M.D on 08/04/2014 at 10:53 AM  Between 7am to 7pm - Pager - [phone]  After 7pm go to [email or web address] - password [email or web address]  And look for the night coverage person covering for me after hours  [facility] Office  [phone]   **Disclaimer: This note may have been dictated with voice recognition software. Similar sounding words can inadvertently be transcribed  and this note may contain transcription errors which may not have been corrected upon publication  of note.**

## 2014-08-05 LAB — BASIC METABOLIC PANEL
ANION GAP: 10 (ref 5–15)
BUN: 34 mg/dL — ABNORMAL HIGH (ref 6–23)
CALCIUM: 8.3 mg/dL — AB (ref 8.4–10.5)
CO2: 24 mEq/L (ref 19–32)
Chloride: 108 mEq/L (ref 96–112)
Creatinine, Ser: 3.89 mg/dL — ABNORMAL HIGH (ref 0.50–1.35)
GFR calc Af Amer: 15 mL/min — ABNORMAL LOW (ref >90)
GFR, EST NON AFRICAN AMERICAN: 13 mL/min — AB (ref >90)
GLUCOSE: 91 mg/dL (ref 70–99)
POTASSIUM: 4.3 meq/L (ref 3.7–5.3)
SODIUM: 142 meq/L (ref 137–147)

## 2014-08-05 LAB — CBC
HCT: 31.9 % — ABNORMAL LOW (ref 39.0–52.0)
Hemoglobin: 10.6 g/dL — ABNORMAL LOW (ref 13.0–17.0)
MCH: 28.8 pg (ref 26.0–34.0)
MCHC: 33.2 g/dL (ref 30.0–36.0)
MCV: 86.7 fL (ref 78.0–100.0)
PLATELETS: 246 10*3/uL (ref 150–400)
RBC: 3.68 MIL/uL — AB (ref 4.22–5.81)
RDW: 13.3 % (ref 11.5–15.5)
WBC: 9.7 10*3/uL (ref 4.0–10.5)

## 2014-08-05 MED ORDER — AMLODIPINE BESYLATE 10 MG PO TABS: 10.0000 mg | ORAL_TABLET | Freq: Every day | ORAL | Status: DC

## 2014-08-05 MED ORDER — HYDROCORTISONE 2.5 % RE CREA: TOPICAL_CREAM | RECTAL | Status: DC | PRN

## 2014-08-05 MED ORDER — TAMSULOSIN HCL 0.4 MG PO CAPS: 0.4000 mg | ORAL_CAPSULE | Freq: Every day | ORAL | Status: DC

## 2014-08-05 NOTE — Plan of Care (Signed)
Problem: Phase III Progression Outcomes Goal: Foley discontinued Outcome: Adequate for Discharge md ordered to leave foley in

## 2014-08-05 NOTE — Progress Notes (Signed)
I have personally seen and examined this patient and agree with the assessment/plan as outlined above by [place] DO (PGY2). 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 Will continue to follow for renal recovery (baseline unknown as he has not had labs in 10 years).  Neasia Fleeman K.,MD 08/05/2014 11:19 AM

## 2014-08-05 NOTE — Progress Notes (Signed)
NURSING PROGRESS NOTE  Austin Nicholson [id number] Discharge Data: 08/05/2014 3:14 PM Attending Provider: Thurnell Lose, MD FYT:[id number], [name], MD     [name] to be D/C'd Home per MD order.  Discussed with the patient the After Visit Summary and all questions fully answered. All IV's discontinued with no bleeding noted. All belongings returned to patient for patient to take home.   Last Vital Signs:  Blood pressure 155/66, pulse 89, temperature 98.8 F (37.1 C), temperature source Oral, resp. rate 20, height 5\' 6"  (1.676 m), weight 72.893 kg (160 lb 11.2 oz), SpO2 100.00%.  Discharge Medication List   Medication List         amLODipine 10 MG tablet  Commonly known as:  NORVASC  Take 1 tablet (10 mg total) by mouth daily.     hydrocortisone 2.5 % rectal cream  Commonly known as:  ANUSOL-HC  Place rectally every 4 (four) hours as needed for hemorrhoids or itching.     JUICE PLUS FIBRE PO  Take 1 capsule by mouth daily.     tamsulosin 0.4 MG Caps capsule  Commonly known as:  FLOMAX  Take 1 capsule (0.4 mg total) by mouth daily after supper.         Wallie Renshaw, RN

## 2014-08-05 NOTE — Progress Notes (Signed)
Physical Therapy Treatment Patient Details Name: Austin Nicholson MRN: [id number] DOB: 1928-09-03 Today's Date: 08/05/2014    History of Present Illness Patient is a 80 y/o male admitted for urinary incontinence, diagnosed with Bil hydronephrosis secondary to BPH and ARF. Pt reports that for the past several weeks he has urinary incontinence requiring use of adult diapers and also noted that his urine output is decreased. He reports that a couple of weeks ago he noted a 'lump'in his abdomen. Friends took him to their PCP and it was recommended he come to the ED however pt did not as he thought "lump" got smaller. In ED, creatinine 5.14 with a BUN of 53 . Ct abdomen and pelvis-pronounced bladder distention causing severe hydronephrosis with enlargement of the prostate.-    PT Comments    Patient progressing well with mobility. Balance much improved from prior session as pt able to ambulate without use of AD. Tolerated more challenging balance activities during gait training today. Balance deficits noted when distracted or with direction changes however no overt LOB. Recommend supervision at home for safety as pt with poor safety awareness. Will continue to follow and progress as tolerated.   Follow Up Recommendations  Supervision/Assistance - 24 hour;Home health PT     Equipment Recommendations  None recommended by PT    Recommendations for Other Services       Precautions / Restrictions Precautions Precautions: Fall Restrictions Weight Bearing Restrictions: No    Mobility  Bed Mobility Overal bed mobility: Needs Assistance Bed Mobility: Supine to Sit     Supine to sit: Modified independent (Device/Increase time);HOB elevated     General bed mobility comments: Use of rails.  Transfers Overall transfer level: Needs assistance Equipment used: None Transfers: Sit to/from Stand Sit to Stand: Min guard         General transfer comment: Min guard for safety as pt mildly  impulsive upon standing. LOB onto bed when answering phone.  Ambulation/Gait Ambulation/Gait assistance: Min guard Ambulation Distance (Feet): 350 Feet Assistive device: None Gait Pattern/deviations: Step-through pattern;Decreased stride length;Drifts right/left Gait velocity: 2.0 ft/sec   General Gait Details: Performed dynamic challenging balance activities during gait. See balance section. Intermittent changes in gait speed unintentionally. Balance impaired during distraction or head movements however no overt LOB, able to recover.   Stairs            Wheelchair Mobility    Modified Rankin (Stroke Patients Only)       Balance     Sitting balance-Leahy Scale: Fair       Standing balance-Leahy Scale: Fair               High level balance activites: Side stepping;Backward walking;Direction changes;Head turns;Sudden stops High Level Balance Comments: Pt side stepping to Right and left ~50' both directions, no LOB. Backward walking ~50' with mild unsteadiness. Able to perform head turns in all directions with decrease in gait speed.     Cognition Arousal/Alertness: Awake/alert Behavior During Therapy: WFL for tasks assessed/performed Overall Cognitive Status: No family/caregiver present to determine baseline cognitive functioning Area of Impairment: Safety/judgement;Following commands         Safety/Judgement: Decreased awareness of safety;Decreased awareness of deficits   Problem Solving: Requires verbal cues;Requires tactile cues General Comments: Requires repetition to perform tasks.    Exercises      General Comments        Pertinent Vitals/Pain Pain Assessment: No/denies pain    Home Living  Prior Function            PT Goals (current goals can now be found in the care plan section) Progress towards PT goals: Progressing toward goals    Frequency       PT Plan Discharge plan needs to be updated     Co-evaluation             End of Session Equipment Utilized During Treatment: Gait belt Activity Tolerance: Patient tolerated treatment well Patient left: in chair;with call bell/phone within reach;with chair alarm set     Time: 1313-1336 PT Time Calculation (min): 23 min  Charges:  $Gait Training: 8-22 mins $Therapeutic Exercise: 8-22 mins                    G CodesCandy Sledge A Aug 11, 2014, 2:14 PM [name], [place], DPT [phone]

## 2014-08-05 NOTE — Progress Notes (Signed)
Patient ID: Austin Nicholson, male   DOB: 1928/02/12, 80 y.o.   MRN: [id number] Patient without complaints. He is walking the halls.  Filed Vitals:   08/05/14 0402  BP: 154/72  Pulse: 72  Temp: 98.8 F (37.1 C)  Resp: 20    Intake/Output Summary (Last 24 hours) at 08/05/14 1660 Last data filed at 08/05/14 0450  Gross per 24 hour  Intake    120 ml  Output   4000 ml  Net  -3880 ml    PE: Walking halls - urine light pink, red - no clots.   A/P: 1) ARF, bilateral hydronephrosis - Urine output remains excellent, creatinine improving.  At this point I don't feel the benefit of stents or perc tubes outweigh the risks associated with these procedures.   2) gross hematuria - will need out patient cystoscopy at some point.  3) elevated PSA, abnormal DRE, Enhancing area posterior prostate on CT scan  - He will need prostate biopsy in outpatient. I discussed with patient nature, r/b alternatives to trus prostate bx including risk of bleeding and infection among others.  4) BPH (125 g), retention s/p foley   Discussed long term plan for prostate biopsy with initiation of androgen deprivation and tamsulosin.  Hopefully this will render prostate cancer in remission and shrink/relax prostate enough for him to void.  If not we will need to consider longer term cath or channel TURP.   He can be discharged from urologic point of view as long as nephrology feels like his fluid status, renal function is stable.

## 2014-08-05 NOTE — Progress Notes (Signed)
S:Patient reports he is feeling well, no complaints. O:BP 154/72  Pulse 72  Temp(Src) 98.8 F (37.1 C) (Oral)  Resp 20  Ht 5\' 6"  (1.676 m)  Wt 160 lb 11.2 oz (72.893 kg)  BMI 25.95 kg/m2  SpO2 96%  Intake/Output Summary (Last 24 hours) at 08/05/14 0812 Last data filed at 08/05/14 0450  Gross per 24 hour  Intake 1357.5 ml  Output   4300 ml  Net -2942.5 ml   Intake/Output: I/O last 3 completed shifts: In: 1597.5 [P.O.:360; I.V.:1237.5] Out: 6800 [Urine:6800]  Intake/Output this shift:    Weight change: -3 lb 8 oz (-1.588 kg) DJS:[id number] in bed in NAD CVS:RRR Resp:CTAB Abd:+BS, soft, nontender, nondistended GU: foley in place draining reddish yellow urine. Ext:no edema   Recent Labs Lab 08/01/14 1417 08/01/14 2214 08/02/14 1030 08/02/14 1900 08/02/14 2217 08/03/14 0843 08/04/14 0650  NA 144 143 141 140  --  145 144  K 4.8 4.2 4.8 3.9  --  4.6 4.3  CL 105 105 107 103  --  109 108  CO2 24 22 20 23   --  22 21  GLUCOSE 96 86 125* 123*  --  105* 93  BUN 53* 52* 50* 52*  --  51* 42*  CREATININE 5.14* 5.08* 4.60* 4.70*  --  4.88* 4.13*  ALBUMIN 3.8  --   --   --   --   --   --   CALCIUM 9.1 8.7 8.0* 8.3*  --  8.3* 8.0*  PHOS  --  4.3 4.2  --  4.0 4.1  --   AST 12  --   --   --   --   --   --   ALT 7  --   --   --   --   --   --    Liver Function Tests:  Recent Labs Lab 08/01/14 1417  AST 12  ALT 7  ALKPHOS 46  BILITOT 0.4  PROT 7.8  ALBUMIN 3.8   No results found for this basename: LIPASE, AMYLASE,  in the last 168 hours No results found for this basename: AMMONIA,  in the last 168 hours CBC:  Recent Labs Lab 08/01/14 1417 08/02/14 1030 08/04/14 0650  WBC 8.7 9.8 9.5  NEUTROABS 6.4  --   --   HGB 10.7* 11.2* 10.6*  HCT 31.1* 33.7* 31.0*  MCV 85.2 86.4 86.4  PLT 236 216 214   Cardiac Enzymes:  Recent Labs Lab 08/01/14 2214 08/02/14 0718 08/02/14 1030  TROPONINI <0.30 <0.30 <0.30   CBG:  Recent Labs Lab 08/02/14 2011  GLUCAP 144*     Iron Studies: No results found for this basename: IRON, TIBC, TRANSFERRIN, FERRITIN,  in the last 72 hours Studies/Results: US Renal  08/03/2014   CLINICAL DATA:  Urinary incontinence. Hydronephrosis on prior CT scan.  EXAM: RENAL/URINARY TRACT ULTRASOUND COMPLETE  COMPARISON:  CT abdomen and pelvis 08/01/2014.  FINDINGS: Right Kidney:  Length: 11.9 cm. Moderate right hydronephrosis does not appear markedly changed compared to the prior examination. 3.8 cm lower pole cyst is identified as on the prior study.  Left Kidney:  Length: 10.2 cm. Moderate left hydronephrosis does not appear markedly changed. The left kidney is otherwise unremarkable.  Bladder:  Completely decompressed with a Foley catheter in place.  IMPRESSION: Moderate bilateral hydronephrosis does not appear markedly changed compared to the prior study.  The urinary bladder is completely decompressed with a Foley catheter in place.   Electronically Signed  By: Inge Rise M.D.   On: 08/03/2014 15:12   . amLODipine  10 mg Oral Daily  . heparin subcutaneous  5,000 Units Subcutaneous 3 times per day  . pneumococcal 23 valent vaccine  0.5 mL Intramuscular Tomorrow-1000    BMET    Component Value Date/Time   NA 144 08/04/2014 0650   K 4.3 08/04/2014 0650   CL 108 08/04/2014 0650   CO2 21 08/04/2014 0650   GLUCOSE 93 08/04/2014 0650   BUN 42* 08/04/2014 0650   CREATININE 4.13* 08/04/2014 0650   CALCIUM 8.0* 08/04/2014 0650   GFRNONAA 12* 08/04/2014 0650   GFRAA 14* 08/04/2014 0650   CBC    Component Value Date/Time   WBC 9.5 08/04/2014 0650   RBC 3.59* 08/04/2014 0650   RBC 3.32* 08/01/2014 2214   HGB 10.6* 08/04/2014 0650   HCT 31.0* 08/04/2014 0650   PLT 214 08/04/2014 0650   MCV 86.4 08/04/2014 0650   MCH 29.5 08/04/2014 0650   MCHC 34.2 08/04/2014 0650   RDW 13.3 08/04/2014 0650   LYMPHSABS 1.6 08/01/2014 1417   MONOABS 0.4 08/01/2014 1417   EOSABS 0.2 08/01/2014 1417   BASOSABS 0.1 08/01/2014 1417      Assessment/Plan:  1. Acute Renal Failure: Secondary to obstructive uropathy as well as contrast induced nephropathy.  He continues to have great urine output with 4.3L out yesterday.  His SCr continues to trend down slowly.  He has no urgent indications for dialysis. 2. Bladder Outlet Obstruction/Enlarged Prostate/Elevated PSA: Foley in place, Urology following 3. Anemia: mild, stable, unclear baseline, may be due to underlying CKD, malignancy, or other chronic inflammatory state. 4. Hypertension: mildly elevated, currently on amlodipine 10mg  daily, and hydralazine PRN  Lucious Groves, DO IMTS, PGY2

## 2014-08-05 NOTE — Discharge Summary (Signed)
Austin Nicholson, is a 80 y.o. male  DOB [birth year]  MRN [id number].  Admission date:  08/01/2014  Admitting Physician  Sheila Oats, MD  Discharge Date:  08/05/2014   Primary MD  Redge Gainer, MD  Recommendations for primary care physician for things to follow:   Check CBC and CMP in a week, outpatient urology followup recommended   Admission Diagnosis  Renal failure [586] Enlarged prostate [600.00] HTN (hypertension), malignant [401.0] Hydronephrosis, unspecified hydronephrosis type [591]   Discharge Diagnosis  Renal failure [586] Enlarged prostate [600.00] HTN (hypertension), malignant [401.0] Hydronephrosis, unspecified hydronephrosis type [591]    Active Problems:   Renal failure   HTN (hypertension), malignant   Hydronephrosis   Enlarged prostate   Anemia      Past Medical History  Diagnosis Date  . Cancer     Past Surgical History  Procedure Laterality Date  . Basal cell carcinoma excision         History of present illness and  Hospital Course:     Kindly see H&P for history of present illness and admission details, please review complete Labs, Consult reports and Test reports for all details in brief  HPI  from the history and physical done on the day of admission   Austin Nicholson is an 80 y.o. male with a history of basal cell cancer who presents with above complaints. He is a poor historian. He reports that for the past several weeks he has urinary incontinence requiring use of adult diapers and also noted that his urine output is decreased. He reports that a couple of weeks ago he noted a 'lump'in his abdomen-he does not have a PCP so his family friends took him 3 days ago to see Dr. Arelia Sneddon who is their PCP. Per patient he recommended the patient come to the hospital but he thought the lump was  reducing in size so he decided not to at the time. Last night he was convinced by his friends to come to the ED today for further evaluation. Patient denies abdominal pain, fevers, cough, shortness of breath diarrhea melena and no chest pain.  He was seen in the ED and labs revealed a creatinine of 5.14 with a BUN of 53. A CT scan of the abdomen and pelvis  Was done and showed pronounced bladder distention causing severe hydronephrosis with enlargement of the prostate. Also multiple nonacute findings including tiny liver lesion renal cyst adrenal nodule-see full report of CT, when noted. Urology was consulted and patient is admitted for further valuation and management.    Hospital Course    1. Bilateral hydronephrosis with renal failure. Secondary to enlarged prostate, question prostate cancer. He is status post Foley catheter placement, creatinine has improved albeit gradually, good urine output, called her PCP office he has had no blood work in the last 10 years. In by renal, renal function is much improved he will be discharged with Foley and Flomax with outpatient followup with urology.     2.HTN - on Norvasc. Continue  to monitor BP and his medications in the outpatient setting as needed.     3. Hematuria. Improving. Monitor.     4. Anemia of chronic disease. Anemia panel stable.    5. Acute renal failure. He due to compression of #1 also contrast nephropathy which he received for a CT scan in the ER. Serially improving after Foley catheter placement and hydration, repeat BMP in a week by PCP.    Discharge Condition: stable   Follow UP  Follow-up Information   Follow up with [facility]     On 08/08/2014. (9am)    Contact information:   [place] [address] [phone]      Follow up with Redge Gainer, MD. Schedule an appointment as soon as possible for a visit in 1 week.   Specialty:  Family Medicine   Contact  information:   [place] [address] [phone]       Follow up with Festus Aloe, MD. Schedule an appointment as soon as possible for a visit in 1 week.   Specialty:  Urology   Contact information:   [place] [address] [phone]         Discharge Instructions  and  Discharge Medications     Discharge Instructions   Diet - low sodium heart healthy    Complete by:  As directed      Discharge instructions    Complete by:  As directed   Follow with Primary MD Redge Gainer, MD in 7 days   Get CBC, CMP, 2 view Chest X ray checked  by Primary MD next visit.    Activity: As tolerated with Full fall precautions use walker/cane & assistance as needed   Disposition Home     Diet: Heart Healthy    For Heart failure patients - Check your Weight same time everyday, if you gain over 2 pounds, or you develop in leg swelling, experience more shortness of breath or chest pain, call your Primary MD immediately. Follow Cardiac Low Salt Diet and 1.8 lit/day fluid restriction.   On your next visit with her primary care physician please Get Medicines reviewed and adjusted.  Please request your Prim.MD to go over all Hospital Tests and Procedure/Radiological results at the follow up, please get all Hospital records sent to your Prim MD by signing hospital release before you go home.   If you experience worsening of your admission symptoms, develop shortness of breath, life threatening emergency, suicidal or homicidal thoughts you must seek medical attention immediately by calling 911 or calling your MD immediately  if symptoms less severe.  You Must read complete instructions/literature along with all the possible adverse reactions/side effects for all the Medicines you take and that have been prescribed to you. Take any new Medicines after you have completely understood and accpet all the possible adverse reactions/side effects.   Do not drive,  operating heavy machinery, perform activities at heights, swimming or participation in water activities or provide baby sitting services if your were admitted for syncope or siezures until you have seen by Primary MD or a Neurologist and advised to do so again.  Do not drive when taking Pain medications.    Do not take more than prescribed Pain, Sleep and Anxiety Medications  Special Instructions: If you have smoked or chewed Tobacco  in the last 2 yrs please stop smoking, stop any regular Alcohol  and or any Recreational  drug use.  Wear Seat belts while driving.   Please note  You were cared for by a hospitalist during your hospital stay. If you have any questions about your discharge medications or the care you received while you were in the hospital after you are discharged, you can call the unit and asked to speak with the hospitalist on call if the hospitalist that took care of you is not available. Once you are discharged, your primary care physician will handle any further medical issues. Please note that NO REFILLS for any discharge medications will be authorized once you are discharged, as it is imperative that you return to your primary care physician (or establish a relationship with a primary care physician if you do not have one) for your aftercare needs so that they can reassess your need for medications and monitor your lab values.  Cardiac Diet This diet can help prevent heart disease and stroke. Many factors influence your heart health, including eating and exercise habits. Coronary risk rises a lot with abnormal blood fat (lipid) levels. Cardiac meal planning includes limiting unhealthy fats, increasing healthy fats, and making other small dietary changes. General guidelines are as follows: Adjust calorie intake to reach and maintain desirable body weight. Limit total fat intake to less than 30% of total calories. Saturated fat should be less than 7% of calories. Saturated fats  are found in animal products and in some vegetable products. Saturated vegetable fats are found in coconut oil, cocoa butter, palm oil, and palm kernel oil. Read labels carefully to avoid these products as much as possible. Use butter in moderation. Choose tub margarines and oils that have 2 grams of fat or less. Good cooking oils are canola and olive oils. Practice low-fat cooking techniques. Do not fry food. Instead, broil, bake, boil, steam, grill, roast on a rack, stir-fry, or microwave it. Other fat reducing suggestions include: Remove the skin from poultry. Remove all visible fat from meats. Skim the fat off stews, soups, and gravies before serving them. Steam vegetables in water or broth instead of sauting them in fat. Avoid foods with trans fat (or hydrogenated oils), such as commercially fried foods and commercially baked goods. Commercial shortening and deep-frying fats will contain trans fat. Increase intake of fruits, vegetables, whole grains, and legumes to replace foods high in fat. Increase consumption of nuts, legumes, and seeds to at least 4 servings weekly. One serving of a legume equals  cup, and 1 serving of nuts or seeds equals  cup. Choose whole grains more often. Have 3 servings per day (a serving is 1 ounce [oz]). Eat 4 to 5 servings of vegetables per day. A serving of vegetables is 1 cup of raw leafy vegetables;  cup of raw or cooked cut-up vegetables;  cup of vegetable juice. Eat 4 to 5 servings of fruit per day. A serving of fruit is 1 medium whole fruit;  cup of dried fruit;  cup of fresh, frozen, or canned fruit;  cup of 100% fruit juice. Increase your intake of dietary fiber to 20 to 30 grams per day. Insoluble fiber may help lower your risk of heart disease and may help curb your appetite.  Soluble fiber binds cholesterol to be removed from the blood. Foods high in soluble fiber are dried beans, citrus fruits, oats, apples, bananas, broccoli, Brussels sprouts, and  eggplant. Try to include foods fortified with plant sterols or stanols, such as yogurt, breads, juices, or margarines. Choose several fortified foods to achieve a  daily intake of 2 to 3 grams of plant sterols or stanols. Foods with omega-3 fats can help reduce your risk of heart disease. Aim to have a 3.5 oz portion of fatty fish twice per week, such as salmon, mackerel, albacore tuna, sardines, lake trout, or herring. If you wish to take a fish oil supplement, choose one that contains 1 gram of both DHA and EPA. Limit processed meats to 2 servings (3 oz portion) weekly. Limit the sodium in your diet to 1500 milligrams (mg) per day. If you have high blood pressure, talk to a registered dietitian about a DASH (Dietary Approaches to Stop Hypertension) eating plan. Limit sweets and beverages with added sugar, such as soda, to no more than 5 servings per week. One serving is:  1 tablespoon sugar. 1 tablespoon jelly or jam.  cup sorbet. 1 cup lemonade.  cup regular soda. CHOOSING FOODS Starches Allowed: Breads: All kinds (wheat, rye, raisin, white, oatmeal, [place], [place], and English muffin bread). Low-fat rolls: English muffins, frankfurter and hamburger buns, bagels, pita bread, tortillas (not fried). Pancakes, waffles, biscuits, and muffins made with recommended oil. Avoid: Products made with saturated or trans fats, oils, or whole milk products. Butter rolls, cheese breads, croissants. Commercial doughnuts, muffins, sweet rolls, biscuits, waffles, pancakes, store-bought mixes. Crackers Allowed: Low-fat crackers and snacks: Animal, graham, rye, saltine (with recommended oil, no lard), oyster, and matzo crackers. Bread sticks, melba toast, rusks, flatbread, pretzels, and light popcorn. Avoid: High-fat crackers: cheese crackers, butter crackers, and those made with coconut, palm oil, or trans fat (hydrogenated oils). Buttered popcorn. Cereals Allowed: Hot or cold whole-grain cereals. Avoid:  Cereals containing coconut, hydrogenated vegetable fat, or animal fat. Potatoes / Pasta / Rice Allowed: All kinds of potatoes, rice, and pasta (such as macaroni, spaghetti, and noodles). Avoid: Pasta or rice prepared with cream sauce or high-fat cheese. Chow mein noodles, [place] fries. Vegetables Allowed: All vegetables and vegetable juices. Avoid: Fried vegetables. Vegetables in cream, butter, or high-fat cheese sauces. Limit coconut. Fruit in cream or custard. Protein Allowed: Limit your intake of meat, seafood, and poultry to no more than 6 oz (cooked weight) per day. All lean, well-trimmed beef, veal, pork, and lamb. All chicken and [place] without skin. All fish and shellfish. Wild game: wild duck, rabbit, pheasant, and venison. Egg whites or low-cholesterol egg substitutes may be used as desired. Meatless dishes: recipes with dried beans, peas, lentils, and tofu (soybean curd). Seeds and nuts: all seeds and most nuts. Avoid: Prime grade and other heavily marbled and fatty meats, such as short ribs, spare ribs, rib eye roast or steak, frankfurters, sausage, bacon, and high-fat luncheon meats, mutton. Caviar. Commercially fried fish. Domestic duck, goose, venison sausage. Organ meats: liver, gizzard, heart, chitterlings, brains, kidney, sweetbreads. Dairy Allowed: Low-fat cheeses: nonfat or low-fat cottage cheese (1% or 2% fat), cheeses made with part skim milk, such as mozzarella, farmers, string, or ricotta. (Cheeses should be labeled no more than 2 to 6 grams fat per oz.). Skim (or 1%) milk: liquid, powdered, or evaporated. Buttermilk made with low-fat milk. Drinks made with skim or low-fat milk or cocoa. Chocolate milk or cocoa made with skim or low-fat (1%) milk. Nonfat or low-fat yogurt. Avoid: Whole milk cheeses, including colby, cheddar, [place], Monterey Jack, [place], [place], [place], American, Swiss, and blue. Creamed cottage cheese, cream cheese. Whole milk and whole milk products,  including buttermilk or yogurt made from whole milk, drinks made from whole milk. Condensed milk, evaporated whole milk, and 2% milk. Soups  and Combination Foods Allowed: Low-fat low-sodium soups: broth, dehydrated soups, homemade broth, soups with the fat removed, homemade cream soups made with skim or low-fat milk. Low-fat spaghetti, lasagna, chili, and Spanish rice if low-fat ingredients and low-fat cooking techniques are used. Avoid: Cream soups made with whole milk, cream, or high-fat cheese. All other soups. Desserts and Sweets Allowed: Sherbet, fruit ices, gelatins, meringues, and angel food cake. Homemade desserts with recommended fats, oils, and milk products. Jam, jelly, honey, marmalade, sugars, and syrups. Pure sugar candy, such as gum drops, hard candy, jelly beans, marshmallows, mints, and small amounts of dark chocolate. Avoid: Commercially prepared cakes, pies, cookies, frosting, pudding, or mixes for these products. Desserts containing whole milk products, chocolate, coconut, lard, palm oil, or palm kernel oil. Ice cream or ice cream drinks. Candy that contains chocolate, coconut, butter, hydrogenated fat, or unknown ingredients. Buttered syrups. Fats and Oils Allowed: Vegetable oils: safflower, sunflower, corn, soybean, cottonseed, sesame, canola, olive, or peanut. Non-hydrogenated margarines. Salad dressing or mayonnaise: homemade or commercial, made with a recommended oil. Low or nonfat salad dressing or mayonnaise. Limit added fats and oils to 6 to 8 tsp per day (includes fats used in cooking, baking, salads, and spreads on bread). Remember to count the "hidden fats" in foods. Avoid: Solid fats and shortenings: butter, lard, salt pork, bacon drippings. Gravy containing meat fat, shortening, or suet. Cocoa butter, coconut. Coconut oil, palm oil, palm kernel oil, or hydrogenated oils: these ingredients are often used in bakery products, nondairy creamers, whipped toppings, candy, and  commercially fried foods. Read labels carefully. Salad dressings made of unknown oils, sour cream, or cheese, such as blue cheese and Roquefort. Cream, all kinds: half-and-half, light, heavy, or whipping. Sour cream or cream cheese (even if "light" or low-fat). Nondairy cream substitutes: coffee creamers and sour cream substitutes made with palm, palm kernel, hydrogenated oils, or coconut oil. Beverages Allowed: Coffee (regular or decaffeinated), tea. Diet carbonated beverages, mineral water. Alcohol: Check with your caregiver. Moderation is recommended. Avoid: Whole milk, regular sodas, and juice drinks with added sugar. Condiments Allowed: All seasonings and condiments. Cocoa powder. "Cream" sauces made with recommended ingredients. Avoid: Carob powder made with hydrogenated fats. SAMPLE MENU Breakfast  cup orange juice  cup oatmeal 1 slice toast 1 tsp margarine 1 cup skim milk Lunch [place] sandwich with 2 oz [place], 2 slices bread Lettuce and tomato slices Fresh fruit Carrot sticks Coffee or tea Snack Fresh fruit or low-fat crackers Dinner 3 oz lean ground beef 1 baked potato 1 tsp margarine  cup asparagus Lettuce salad 1 tbs non-creamy dressing  cup peach slices 1 cup skim milk Document Released: 09/03/2008 Document Revised: 05/26/2012 Document Reviewed: 01/25/2014 Exit[employer] Patient Information 2015 [place], [place]. This information is not intended to replace advice given to you by your health care provider. Make sure you discuss any questions you have with your health care provider.     Increase activity slowly    Complete by:  As directed             Medication List         amLODipine 10 MG tablet  Commonly known as:  NORVASC  Take 1 tablet (10 mg total) by mouth daily.     hydrocortisone 2.5 % rectal cream  Commonly known as:  ANUSOL-HC  Place rectally every 4 (four) hours as needed for hemorrhoids or itching.     JUICE PLUS FIBRE PO  Take 1 capsule by  mouth daily.  tamsulosin 0.4 MG Caps capsule  Commonly known as:  FLOMAX  Take 1 capsule (0.4 mg total) by mouth daily after supper.          Diet and Activity recommendation: See Discharge Instructions above   Consults obtained - Urology,Renal   Major procedures and Radiology Reports - PLEASE review detailed and final reports for all details, in brief -      Ct Abdomen Pelvis W Contrast  08/01/2014   CLINICAL DATA:  Urinary incontinence for several weeks may urinary frequency, abdomen mass  EXAM: CT ABDOMEN AND PELVIS WITH CONTRAST  TECHNIQUE: Multidetector CT imaging of the abdomen and pelvis was performed using the standard protocol following bolus administration of intravenous contrast.  CONTRAST:  163mL OMNIPAQUE IOHEXOL 300 MG/ML  SOLN  COMPARISON:  None.  FINDINGS: Discoid atelectasis left lung base. 2 mm nodular opacity right lung base, pleural-based, most likely a tiny focus of peripheral atelectasis.  4 mm low-attenuation lesion posterior right lobe of liver image 21, too small characterize. Liver otherwise normal. Gallbladder and spleen normal. Pancreas normal. Left adrenal gland normal. 8 mm right adrenal nodule.  Calcified aorta without dilatation. Severe bilateral hydronephrosis. 4.3 cm cystic lesion lower pole right kidney with thin stripe of hyperattenuation in the posterior ram. There are few tiny low-attenuation lesions in the midpole left kidney which are too small characterize.  No ascites. No significant adenopathy in the abdomen or pelvis. No acute musculoskeletal findings or suspicious focal osseous findings.  Markedly bladder distension. Bladder is distended to 16 x 15 x 15 cm. Prostate is enlarged to a diameter 6.3 x 5.9 cm.  IMPRESSION: 1. Pronounced bladder distension causing severe hydronephrosis. There is enlargement of the prostate. Correlate with PSA levels, with differential diagnostic possibilities including prostate hypertrophy and carcinoma. 2. Multiple  nonacute findings including tiny liver lesion that is too small to characterize in which may be a cyst and a tiny adrenal nodule on the right which does not require further workup based on small 5. Right renal lesion may represent a cyst with some dense material layering dependently or thin focus of calcification in the Re and. No precontrast images available on this study and therefore min enhancement not excluded. Consider renal ultrasound or renal protocol CT to characterize further. Also tiny left renal lesions too small to characterize but which may represent cysts.   Electronically Signed   By: Skipper Cliche M.D.   On: 08/01/2014 17:13   US Renal  08/03/2014   CLINICAL DATA:  Urinary incontinence. Hydronephrosis on prior CT scan.  EXAM: RENAL/URINARY TRACT ULTRASOUND COMPLETE  COMPARISON:  CT abdomen and pelvis 08/01/2014.  FINDINGS: Right Kidney:  Length: 11.9 cm. Moderate right hydronephrosis does not appear markedly changed compared to the prior examination. 3.8 cm lower pole cyst is identified as on the prior study.  Left Kidney:  Length: 10.2 cm. Moderate left hydronephrosis does not appear markedly changed. The left kidney is otherwise unremarkable.  Bladder:  Completely decompressed with a Foley catheter in place.  IMPRESSION: Moderate bilateral hydronephrosis does not appear markedly changed compared to the prior study.  The urinary bladder is completely decompressed with a Foley catheter in place.   Electronically Signed   By: Inge Rise M.D.   On: 08/03/2014 15:12    Micro Results     Recent Results (from the past 240 hour(s))  URINE CULTURE     Status: None   Collection Time    08/01/14 12:03 PM  Result Value Ref Range Status   Specimen Description URINE, RANDOM   Final   Special Requests ADDED AT 1805 ON 010932   Final   Culture  Setup Time     Final   Value: 08/01/2014 19:17     Performed at [facility]     Final   Value: 7,000 COLONIES/ML       Performed at [facility]   Culture     Final   Value: INSIGNIFICANT GROWTH     Performed at [facility]   Report Status 08/03/2014 FINAL   Final  MRSA PCR SCREENING     Status: None   Collection Time    08/02/14 11:46 AM      Result Value Ref Range Status   MRSA by PCR NEGATIVE  NEGATIVE Final   Comment:            The GeneXpert MRSA Assay (FDA     approved for NASAL specimens     only), is one component of a     comprehensive MRSA colonization     surveillance program. It is not     intended to diagnose MRSA     infection nor to guide or     monitor treatment for     MRSA infections.       Today   Subjective:   Austin Nicholson today has no headache,no chest abdominal pain,no new weakness tingling or numbness, feels much better wants to go home today.   Objective:   Blood pressure 163/69, pulse 72, temperature 98.8 F (37.1 C), temperature source Oral, resp. rate 20, height 5\' 6"  (1.676 m), weight 72.893 kg (160 lb 11.2 oz), SpO2 96.00%.   Intake/Output Summary (Last 24 hours) at 08/05/14 1308 Last data filed at 08/05/14 1000  Gross per 24 hour  Intake    800 ml  Output   3800 ml  Net  -3000 ml    Exam Awake Alert, Oriented x 3, No new F.N deficits, Normal affect [place].AT,PERRAL Supple Neck,No JVD, No cervical lymphadenopathy appriciated.  Symmetrical Chest wall movement, Good air movement bilaterally, CTAB RRR,No Gallops,Rubs or new Murmurs, No Parasternal Heave +ve B.Sounds, Abd Soft, Non tender, No organomegaly appriciated, No rebound -guarding or rigidity. No Cyanosis, Clubbing or edema, No new Rash or bruise  Data Review   CBC w Diff: Lab Results  Component Value Date   WBC 9.7 08/05/2014   HGB 10.6* 08/05/2014   HCT 31.9* 08/05/2014   PLT 246 08/05/2014   LYMPHOPCT 19 08/01/2014   MONOPCT 5 08/01/2014   EOSPCT 2 08/01/2014   BASOPCT 1 08/01/2014    CMP: Lab Results  Component Value Date   NA 142 08/05/2014   K 4.3 08/05/2014   CL  108 08/05/2014   CO2 24 08/05/2014   BUN 34* 08/05/2014   CREATININE 3.89* 08/05/2014   PROT 7.8 08/01/2014   ALBUMIN 3.8 08/01/2014   BILITOT 0.4 08/01/2014   ALKPHOS 46 08/01/2014   AST 12 08/01/2014   ALT 7 08/01/2014  .   Total Time in preparing paper work, data evaluation and todays exam - 35 minutes  Thurnell Lose M.D on 08/05/2014 at [facility]  [phone]   **Disclaimer: This note may have been dictated with voice recognition software. Similar sounding words can inadvertently be transcribed and this note may contain transcription errors which may not have been corrected upon publication of note.**

## 2014-08-05 NOTE — Progress Notes (Signed)
Patient refused pneumococcal vaccine. Education provided and patient still refused. MD Candiss Norse aware

## 2014-08-05 NOTE — Discharge Instructions (Signed)
Follow with Primary MD Redge Gainer, MD in 7 days   Get CBC, CMP, 2 view Chest X ray checked  by Primary MD next visit.    Activity: As tolerated with Full fall precautions use walker/cane & assistance as needed   Disposition Home     Diet: Heart Healthy    For Heart failure patients - Check your Weight same time everyday, if you gain over 2 pounds, or you develop in leg swelling, experience more shortness of breath or chest pain, call your Primary MD immediately. Follow Cardiac Low Salt Diet and 1.8 lit/day fluid restriction.   On your next visit with her primary care physician please Get Medicines reviewed and adjusted.  Please request your Prim.MD to go over all Hospital Tests and Procedure/Radiological results at the follow up, please get all Hospital records sent to your Prim MD by signing hospital release before you go home.   If you experience worsening of your admission symptoms, develop shortness of breath, life threatening emergency, suicidal or homicidal thoughts you must seek medical attention immediately by calling 911 or calling your MD immediately  if symptoms less severe.  You Must read complete instructions/literature along with all the possible adverse reactions/side effects for all the Medicines you take and that have been prescribed to you. Take any new Medicines after you have completely understood and accpet all the possible adverse reactions/side effects.   Do not drive, operating heavy machinery, perform activities at heights, swimming or participation in water activities or provide baby sitting services if your were admitted for syncope or siezures until you have seen by Primary MD or a Neurologist and advised to do so again.  Do not drive when taking Pain medications.    Do not take more than prescribed Pain, Sleep and Anxiety Medications  Special Instructions: If you have smoked or chewed Tobacco  in the last 2 yrs please stop smoking, stop any regular  Alcohol  and or any Recreational drug use.  Wear Seat belts while driving.   Please note  You were cared for by a hospitalist during your hospital stay. If you have any questions about your discharge medications or the care you received while you were in the hospital after you are discharged, you can call the unit and asked to speak with the hospitalist on call if the hospitalist that took care of you is not available. Once you are discharged, your primary care physician will handle any further medical issues. Please note that NO REFILLS for any discharge medications will be authorized once you are discharged, as it is imperative that you return to your primary care physician (or establish a relationship with a primary care physician if you do not have one) for your aftercare needs so that they can reassess your need for medications and monitor your lab values.  Cardiac Diet This diet can help prevent heart disease and stroke. Many factors influence your heart health, including eating and exercise habits. Coronary risk rises a lot with abnormal blood fat (lipid) levels. Cardiac meal planning includes limiting unhealthy fats, increasing healthy fats, and making other small dietary changes. General guidelines are as follows:  Adjust calorie intake to reach and maintain desirable body weight.  Limit total fat intake to less than 30% of total calories. Saturated fat should be less than 7% of calories.  Saturated fats are found in animal products and in some vegetable products. Saturated vegetable fats are found in coconut oil, cocoa butter, palm oil, and palm  kernel oil. Read labels carefully to avoid these products as much as possible. Use butter in moderation. Choose tub margarines and oils that have 2 grams of fat or less. Good cooking oils are canola and olive oils.  Practice low-fat cooking techniques. Do not fry food. Instead, broil, bake, boil, steam, grill, roast on a rack, stir-fry, or  microwave it. Other fat reducing suggestions include:  Remove the skin from poultry.  Remove all visible fat from meats.  Skim the fat off stews, soups, and gravies before serving them.  Steam vegetables in water or broth instead of sauting them in fat.  Avoid foods with trans fat (or hydrogenated oils), such as commercially fried foods and commercially baked goods. Commercial shortening and deep-frying fats will contain trans fat.  Increase intake of fruits, vegetables, whole grains, and legumes to replace foods high in fat.  Increase consumption of nuts, legumes, and seeds to at least 4 servings weekly. One serving of a legume equals  cup, and 1 serving of nuts or seeds equals  cup.  Choose whole grains more often. Have 3 servings per day (a serving is 1 ounce [oz]).  Eat 4 to 5 servings of vegetables per day. A serving of vegetables is 1 cup of raw leafy vegetables;  cup of raw or cooked cut-up vegetables;  cup of vegetable juice.  Eat 4 to 5 servings of fruit per day. A serving of fruit is 1 medium whole fruit;  cup of dried fruit;  cup of fresh, frozen, or canned fruit;  cup of 100% fruit juice.  Increase your intake of dietary fiber to 20 to 30 grams per day. Insoluble fiber may help lower your risk of heart disease and may help curb your appetite.  Soluble fiber binds cholesterol to be removed from the blood. Foods high in soluble fiber are dried beans, citrus fruits, oats, apples, bananas, broccoli, Brussels sprouts, and eggplant.  Try to include foods fortified with plant sterols or stanols, such as yogurt, breads, juices, or margarines. Choose several fortified foods to achieve a daily intake of 2 to 3 grams of plant sterols or stanols.  Foods with omega-3 fats can help reduce your risk of heart disease. Aim to have a 3.5 oz portion of fatty fish twice per week, such as salmon, mackerel, albacore tuna, sardines, lake trout, or herring. If you wish to take a fish oil  supplement, choose one that contains 1 gram of both DHA and EPA.  Limit processed meats to 2 servings (3 oz portion) weekly.  Limit the sodium in your diet to 1500 milligrams (mg) per day. If you have high blood pressure, talk to a registered dietitian about a DASH (Dietary Approaches to Stop Hypertension) eating plan.  Limit sweets and beverages with added sugar, such as soda, to no more than 5 servings per week. One serving is:   1 tablespoon sugar.  1 tablespoon jelly or jam.   cup sorbet.  1 cup lemonade.   cup regular soda. CHOOSING FOODS Starches  Allowed: Breads: All kinds (wheat, rye, raisin, white, oatmeal, [place], [place], and English muffin bread). Low-fat rolls: English muffins, frankfurter and hamburger buns, bagels, pita bread, tortillas (not fried). Pancakes, waffles, biscuits, and muffins made with recommended oil.  Avoid: Products made with saturated or trans fats, oils, or whole milk products. Butter rolls, cheese breads, croissants. Commercial doughnuts, muffins, sweet rolls, biscuits, waffles, pancakes, store-bought mixes. Crackers  Allowed: Low-fat crackers and snacks: Animal, graham, rye, saltine (with recommended oil, no  lard), oyster, and matzo crackers. Bread sticks, melba toast, rusks, flatbread, pretzels, and light popcorn.  Avoid: High-fat crackers: cheese crackers, butter crackers, and those made with coconut, palm oil, or trans fat (hydrogenated oils). Buttered popcorn. Cereals  Allowed: Hot or cold whole-grain cereals.  Avoid: Cereals containing coconut, hydrogenated vegetable fat, or animal fat. Potatoes / Pasta / Rice  Allowed: All kinds of potatoes, rice, and pasta (such as macaroni, spaghetti, and noodles).  Avoid: Pasta or rice prepared with cream sauce or high-fat cheese. Chow mein noodles, [place] fries. Vegetables  Allowed: All vegetables and vegetable juices.  Avoid: Fried vegetables. Vegetables in cream, butter, or high-fat cheese  sauces. Limit coconut. Fruit in cream or custard. Protein  Allowed: Limit your intake of meat, seafood, and poultry to no more than 6 oz (cooked weight) per day. All lean, well-trimmed beef, veal, pork, and lamb. All chicken and [place] without skin. All fish and shellfish. Wild game: wild duck, rabbit, pheasant, and venison. Egg whites or low-cholesterol egg substitutes may be used as desired. Meatless dishes: recipes with dried beans, peas, lentils, and tofu (soybean curd). Seeds and nuts: all seeds and most nuts.  Avoid: Prime grade and other heavily marbled and fatty meats, such as short ribs, spare ribs, rib eye roast or steak, frankfurters, sausage, bacon, and high-fat luncheon meats, mutton. Caviar. Commercially fried fish. Domestic duck, goose, venison sausage. Organ meats: liver, gizzard, heart, chitterlings, brains, kidney, sweetbreads. Dairy  Allowed: Low-fat cheeses: nonfat or low-fat cottage cheese (1% or 2% fat), cheeses made with part skim milk, such as mozzarella, farmers, string, or ricotta. (Cheeses should be labeled no more than 2 to 6 grams fat per oz.). Skim (or 1%) milk: liquid, powdered, or evaporated. Buttermilk made with low-fat milk. Drinks made with skim or low-fat milk or cocoa. Chocolate milk or cocoa made with skim or low-fat (1%) milk. Nonfat or low-fat yogurt.  Avoid: Whole milk cheeses, including colby, cheddar, [place], Monterey Jack, [place], [place], [place], American, Swiss, and blue. Creamed cottage cheese, cream cheese. Whole milk and whole milk products, including buttermilk or yogurt made from whole milk, drinks made from whole milk. Condensed milk, evaporated whole milk, and 2% milk. Soups and Combination Foods  Allowed: Low-fat low-sodium soups: broth, dehydrated soups, homemade broth, soups with the fat removed, homemade cream soups made with skim or low-fat milk. Low-fat spaghetti, lasagna, chili, and Spanish rice if low-fat ingredients and low-fat cooking  techniques are used.  Avoid: Cream soups made with whole milk, cream, or high-fat cheese. All other soups. Desserts and Sweets  Allowed: Sherbet, fruit ices, gelatins, meringues, and angel food cake. Homemade desserts with recommended fats, oils, and milk products. Jam, jelly, honey, marmalade, sugars, and syrups. Pure sugar candy, such as gum drops, hard candy, jelly beans, marshmallows, mints, and small amounts of dark chocolate.  Avoid: Commercially prepared cakes, pies, cookies, frosting, pudding, or mixes for these products. Desserts containing whole milk products, chocolate, coconut, lard, palm oil, or palm kernel oil. Ice cream or ice cream drinks. Candy that contains chocolate, coconut, butter, hydrogenated fat, or unknown ingredients. Buttered syrups. Fats and Oils  Allowed: Vegetable oils: safflower, sunflower, corn, soybean, cottonseed, sesame, canola, olive, or peanut. Non-hydrogenated margarines. Salad dressing or mayonnaise: homemade or commercial, made with a recommended oil. Low or nonfat salad dressing or mayonnaise.  Limit added fats and oils to 6 to 8 tsp per day (includes fats used in cooking, baking, salads, and spreads on bread). Remember to count the "hidden fats" in  foods.  Avoid: Solid fats and shortenings: butter, lard, salt pork, bacon drippings. Gravy containing meat fat, shortening, or suet. Cocoa butter, coconut. Coconut oil, palm oil, palm kernel oil, or hydrogenated oils: these ingredients are often used in bakery products, nondairy creamers, whipped toppings, candy, and commercially fried foods. Read labels carefully. Salad dressings made of unknown oils, sour cream, or cheese, such as blue cheese and Roquefort. Cream, all kinds: half-and-half, light, heavy, or whipping. Sour cream or cream cheese (even if "light" or low-fat). Nondairy cream substitutes: coffee creamers and sour cream substitutes made with palm, palm kernel, hydrogenated oils, or coconut  oil. Beverages  Allowed: Coffee (regular or decaffeinated), tea. Diet carbonated beverages, mineral water. Alcohol: Check with your caregiver. Moderation is recommended.  Avoid: Whole milk, regular sodas, and juice drinks with added sugar. Condiments  Allowed: All seasonings and condiments. Cocoa powder. "Cream" sauces made with recommended ingredients.  Avoid: Carob powder made with hydrogenated fats. SAMPLE MENU Breakfast   cup orange juice   cup oatmeal  1 slice toast  1 tsp margarine  1 cup skim milk Lunch  [place] sandwich with 2 oz [place], 2 slices bread  Lettuce and tomato slices  Fresh fruit  Carrot sticks  Coffee or tea Snack  Fresh fruit or low-fat crackers Dinner  3 oz lean ground beef  1 baked potato  1 tsp margarine   cup asparagus  Lettuce salad  1 tbs non-creamy dressing   cup peach slices  1 cup skim milk Document Released: 09/03/2008 Document Revised: 05/26/2012 Document Reviewed: 01/25/2014 ExitCare Patient Information 2015 [place], [place]. This information is not intended to replace advice given to you by your health care provider. Make sure you discuss any questions you have with your health care provider.

## 2014-08-08 ENCOUNTER — Encounter: Payer: Self-pay | Admitting: Internal Medicine

## 2014-08-08 ENCOUNTER — Ambulatory Visit: Payer: Self-pay | Attending: Internal Medicine | Admitting: Internal Medicine

## 2014-08-08 VITALS — BP 139/70 | HR 64 | Temp 99.9°F | Resp 14 | Ht 65.0 in | Wt 159.0 lb

## 2014-08-08 DIAGNOSIS — N3080 Other cystitis without hematuria: Secondary | ICD-10-CM

## 2014-08-08 DIAGNOSIS — N400 Enlarged prostate without lower urinary tract symptoms: Secondary | ICD-10-CM | POA: Insufficient documentation

## 2014-08-08 DIAGNOSIS — Z960 Presence of urogenital implants: Secondary | ICD-10-CM

## 2014-08-08 DIAGNOSIS — I10 Essential (primary) hypertension: Secondary | ICD-10-CM | POA: Insufficient documentation

## 2014-08-08 DIAGNOSIS — Z978 Presence of other specified devices: Secondary | ICD-10-CM

## 2014-08-08 DIAGNOSIS — R32 Unspecified urinary incontinence: Secondary | ICD-10-CM | POA: Insufficient documentation

## 2014-08-08 DIAGNOSIS — R972 Elevated prostate specific antigen [PSA]: Secondary | ICD-10-CM | POA: Insufficient documentation

## 2014-08-08 DIAGNOSIS — R944 Abnormal results of kidney function studies: Secondary | ICD-10-CM | POA: Insufficient documentation

## 2014-08-08 DIAGNOSIS — R7989 Other specified abnormal findings of blood chemistry: Secondary | ICD-10-CM

## 2014-08-08 DIAGNOSIS — T83511A Infection and inflammatory reaction due to indwelling urethral catheter, initial encounter: Secondary | ICD-10-CM | POA: Insufficient documentation

## 2014-08-08 DIAGNOSIS — C801 Malignant (primary) neoplasm, unspecified: Secondary | ICD-10-CM | POA: Insufficient documentation

## 2014-08-08 DIAGNOSIS — R799 Abnormal finding of blood chemistry, unspecified: Secondary | ICD-10-CM

## 2014-08-08 DIAGNOSIS — N3090 Cystitis, unspecified without hematuria: Secondary | ICD-10-CM | POA: Insufficient documentation

## 2014-08-08 DIAGNOSIS — T83518A Infection and inflammatory reaction due to other urinary catheter, initial encounter: Secondary | ICD-10-CM

## 2014-08-08 DIAGNOSIS — Z79899 Other long term (current) drug therapy: Secondary | ICD-10-CM | POA: Insufficient documentation

## 2014-08-08 DIAGNOSIS — Z9889 Other specified postprocedural states: Secondary | ICD-10-CM

## 2014-08-08 LAB — COMPLETE METABOLIC PANEL WITH GFR
ALK PHOS: 45 U/L (ref 39–117)
ALT: 10 U/L (ref 0–53)
AST: 14 U/L (ref 0–37)
Albumin: 3.7 g/dL (ref 3.5–5.2)
BUN: 44 mg/dL — AB (ref 6–23)
CO2: 23 mEq/L (ref 19–32)
Calcium: 9.2 mg/dL (ref 8.4–10.5)
Chloride: 104 mEq/L (ref 96–112)
Creat: 3.35 mg/dL — ABNORMAL HIGH (ref 0.50–1.35)
GFR, Est African American: 18 mL/min — ABNORMAL LOW
GFR, Est Non African American: 16 mL/min — ABNORMAL LOW
Glucose, Bld: 98 mg/dL (ref 70–99)
Potassium: 4.6 mEq/L (ref 3.5–5.3)
SODIUM: 140 meq/L (ref 135–145)
Total Bilirubin: 0.4 mg/dL (ref 0.2–1.2)
Total Protein: 6.6 g/dL (ref 6.0–8.3)

## 2014-08-08 LAB — CBC
HEMATOCRIT: 32.4 % — AB (ref 39.0–52.0)
Hemoglobin: 11.2 g/dL — ABNORMAL LOW (ref 13.0–17.0)
MCH: 28.5 pg (ref 26.0–34.0)
MCHC: 34.6 g/dL (ref 30.0–36.0)
MCV: 82.4 fL (ref 78.0–100.0)
Platelets: 359 10*3/uL (ref 150–400)
RBC: 3.93 MIL/uL — ABNORMAL LOW (ref 4.22–5.81)
RDW: 14.3 % (ref 11.5–15.5)
WBC: 8 10*3/uL (ref 4.0–10.5)

## 2014-08-08 LAB — POCT URINALYSIS DIPSTICK
Bilirubin, UA: NEGATIVE
Glucose, UA: NEGATIVE
Ketones, UA: NEGATIVE
Nitrite, UA: NEGATIVE
PH UA: 5.5
Protein, UA: 300
Spec Grav, UA: 1.02
Urobilinogen, UA: 0.2

## 2014-08-08 MED ORDER — TAMSULOSIN HCL 0.4 MG PO CAPS: 0.4000 mg | ORAL_CAPSULE | Freq: Every day | ORAL | Status: DC

## 2014-08-08 MED ORDER — AMLODIPINE BESYLATE 10 MG PO TABS: 10.0000 mg | ORAL_TABLET | Freq: Every day | ORAL | Status: DC

## 2014-08-08 NOTE — Patient Instructions (Signed)
Foley Catheter Care °A Foley catheter is a soft, flexible tube. This tube is placed into your bladder to drain pee (urine). If you go home with this catheter in place, follow the instructions below. °TAKING CARE OF THE CATHETER °1. Wash your hands with soap and water. °2. Put soap and water on a clean washcloth. °· Clean the skin where the tube goes into your body. °§ Clean away from the tube site. °§ Never wipe toward the tube. °§ Clean the area using a circular motion. °· Remove all the soap. Pat the area dry with a clean towel. For males, reposition the skin that covers the end of the penis (foreskin). °3. Attach the tube to your leg with tape or a leg strap. Do not stretch the tube tight. If you are using tape, remove any stickiness left behind by past tape you used. °4. Keep the drainage bag below your hips. Keep it off the floor. °5. Check your tube during the day. Make sure it is working and draining. Make sure the tube does not curl, twist, or bend. °6. Do not pull on the tube or try to take it out. °TAKING CARE OF THE DRAINAGE BAGS °You will have a large overnight drainage bag and a small leg bag. You may wear the overnight bag any time. Never wear the small bag at night. Follow the directions below. °Emptying the Drainage Bag °Empty your drainage bag when it is  -½ full or at least 2-3 times a day. °1. Wash your hands with soap and water. °2. Keep the drainage bag below your hips. °3. Hold the dirty bag over the toilet or clean container. °4. Open the pour spout at the bottom of the bag. Empty the pee into the toilet or container. Do not let the pour spout touch anything. °5. Clean the pour spout with a gauze pad or cotton ball that has rubbing alcohol on it. °6. Close the pour spout. °7. Attach the bag to your leg with tape or a leg strap. °8. Wash your hands well. °Changing the Drainage Bag °Change your bag once a month or sooner if it starts to smell or look dirty.  °1. Wash your hands with soap and  water. °2. Pinch the rubber tube so that pee does not spill out. °3. Disconnect the catheter tube from the drainage tube at the connection valve. Do not let the tubes touch anything. °4. Clean the end of the catheter tube with an alcohol wipe. Clean the end of a the drainage tube with a different alcohol wipe. °5. Connect the catheter tube to the drainage tube of the clean drainage bag. °6. Attach the new bag to the leg with tape or a leg strap. Avoid attaching the new bag too tightly. °7. Wash your hands well. °Cleaning the Drainage Bag °1. Wash your hands with soap and water. °2. Wash the bag in warm, soapy water. °3. Rinse the bag with warm water. °4. Fill the bag with a mixture of white vinegar and water (1 cup vinegar to 1 quart warm water [.2 liter vinegar to 1 liter warm water]). Close the bag and soak it for 30 minutes in the solution. °5. Rinse the bag with warm water. °6. Hang the bag to dry with the pour spout open and hanging downward. °7. Store the clean bag (once it is dry) in a clean plastic bag. °8. Wash your hands well. °PREVENT INFECTION °· Wash your hands before and after touching your tube. °·   Take showers every day. Wash the skin where the tube enters your body. Do not take baths. Replace wet leg straps with dry ones, if this applies. °· Do not use powders, sprays, or lotions on the genital area. Only use creams, lotions, or ointments as told by your doctor. °· For females, wipe from front to back after going to the bathroom. °· Drink enough fluids to keep your pee clear or pale yellow unless you are told not to have too much fluid (fluid restriction). °· Do not let the drainage bag or tubing touch or lie on the floor. °· Wear cotton underwear to keep the area dry. °GET HELP IF: °· Your pee is cloudy or smells unusually bad. °· Your tube becomes clogged. °· You are not draining pee into the bag or your bladder feels full. °· Your tube starts to leak. °GET HELP RIGHT AWAY IF: °· You have pain,  puffiness (swelling), redness, or yellowish-white fluid (pus) where the tube enters the body. °· You have pain in the belly (abdomen), legs, lower back, or bladder. °· You have a fever. °· You see blood fill the tube, or your pee is pink or red. °· You feel sick to your stomach (nauseous), throw up (vomit), or have chills. °· Your tube gets pulled out. °MAKE SURE YOU:  °· Understand these instructions. °· Will watch your condition. °· Will get help right away if you are not doing well or get worse. °Document Released: 03/22/2013 Document Revised: 04/11/2014 Document Reviewed: 03/22/2013 °ExitCare® Patient Information ©2015 [employer]. This information is not intended to replace advice given to you by your health care provider. Make sure you discuss any questions you have with your health care provider. ° °

## 2014-08-08 NOTE — Progress Notes (Signed)
HFU Pt was admitted to the hospital last week with renal failure, HTN, hydronephrosis and incontinence. Pt feels good today with no C/O.

## 2014-08-08 NOTE — Progress Notes (Signed)
Patient ID: Austin Nicholson, male   DOB: 10-12-1928, 80 y.o.   MRN: [id number]  MPN:[id number]  MGQ:[id number]  DOB - Jan 27, 1928  CC:  Chief Complaint  Patient presents with  . Hospitalization Follow-up  . Establish Care       HPI: Austin Nicholson is a 80 y.o. male here today to establish medical care.  Patient was admitted in hospital last week with and was found to have hydronephrosis and some urinary rentention.  Upon lab assessment he was fount to have a extremely high PSA level and a enlarged prostate.  He presents today with a catheter in place from hospital discharge that is slightly leaking around the insertions site with a foul odor.  He reports that he was unaware that he should have been taking medications for HTN and BPH upon discharge. He lives alone and has his Son available to take him to doctors appointments.   Patient has No headache, No chest pain, No abdominal pain - No Nausea, No new weakness tingling or numbness, No Cough - SOB.  No Known Allergies Past Medical History  Diagnosis Date  . Cancer    Current Outpatient Prescriptions on File Prior to Visit  Medication Sig Dispense Refill  . amLODipine (NORVASC) 10 MG tablet Take 1 tablet (10 mg total) by mouth daily.  30 tablet  0  . hydrocortisone (ANUSOL-HC) 2.5 % rectal cream Place rectally every 4 (four) hours as needed for hemorrhoids or itching.  30 g  0  . Nutritional Supplements (JUICE PLUS FIBRE PO) Take 1 capsule by mouth daily.      . tamsulosin (FLOMAX) 0.4 MG CAPS capsule Take 1 capsule (0.4 mg total) by mouth daily after supper.  30 capsule  0   No current facility-administered medications on file prior to visit.   History reviewed. No pertinent family history. History   Social History  . Marital Status: Widowed    Spouse Name: N/A    Number of Children: N/A  . Years of Education: N/A   Occupational History  . Not on file.   Social History Main Topics  . Smoking status: Never Smoker   . Smokeless  tobacco: Not on file  . Alcohol Use: No  . Drug Use: Not on file  . Sexual Activity: Not on file   Other Topics Concern  . Not on file   Social History Narrative  . No narrative on file    Review of Systems  Eyes: Negative.   Respiratory: Negative.   Cardiovascular: Negative.   Gastrointestinal: Negative.   Genitourinary: Negative for dysuria, urgency, frequency, hematuria and flank pain.       Incontinence Only once per night awakening to void   Musculoskeletal: Negative.   Neurological: Positive for headaches (before hospital admission). Negative for dizziness and tingling.  Psychiatric/Behavioral: Negative.       Objective:   Filed Vitals:   08/08/14 0944  BP: 139/70  Pulse: 64  Temp: 99.9 F (37.7 C)  Resp: 14   Physical Exam  Constitutional: He is oriented to person, place, and time.  HENT:  Right Ear: External ear normal.  Left Ear: External ear normal.  Mouth/Throat: Oropharynx is clear and moist.  Eyes: Conjunctivae and EOM are normal. Pupils are equal, round, and reactive to light.  Neck: Normal range of motion. Neck supple. No thyromegaly present.  Cardiovascular: Normal rate, regular rhythm and normal heart sounds.   Pulmonary/Chest: Effort normal and breath sounds normal.  Abdominal: Soft. Bowel sounds are normal.  Genitourinary: No penile tenderness.  Leakage around catheter site, foul odor   Musculoskeletal: Normal range of motion. He exhibits no edema and no tenderness.  Neurological: He is alert and oriented to person, place, and time.  Skin: Skin is warm and dry.     Lab Results  Component Value Date   WBC 9.7 08/05/2014   HGB 10.6* 08/05/2014   HCT 31.9* 08/05/2014   MCV 86.7 08/05/2014   PLT 246 08/05/2014   Lab Results  Component Value Date   CREATININE 3.89* 08/05/2014   BUN 34* 08/05/2014   NA 142 08/05/2014   K 4.3 08/05/2014   CL 108 08/05/2014   CO2 24 08/05/2014    No results found for this basename: HGBA1C   Lipid Panel  No  results found for this basename: chol, trig, hdl, cholhdl, vldl, ldlcalc       Assessment and plan:   Austin Nicholson was seen today for hospitalization follow-up and establish care.  Diagnoses and associated orders for this visit:  Essential hypertension - Begin (NORVASC) 10 MG tablet; Take 1 tablet (10 mg total) by mouth daily. - CBC  Catheter cystitis, initial encounter - Urine culture - POCT urinalysis dipstick  Elevated PSA - Ambulatory referral to Urology  Elevated serum creatinine - COMPLETE METABOLIC PANEL WITH GFR  Urinary incontinence, unspecified incontinence type - Ambulatory referral to Urology - Begin tamsulosin (FLOMAX) 0.4 MG CAPS capsule; Take 1 capsule (0.4 mg total) by mouth daily after supper. Foley catheter in place - Urine culture - POCT urinalysis dipstick     Return in about 1 week (around 08/15/2014) for BP recheck, Nurse Visit---2 mo PCP.  The patient was given clear instructions to go to ER or return to medical center if symptoms don't improve, worsen or new problems develop. The patient verbalized understanding.  Chari Manning, [facility] [phone] 08/08/2014, 10:13 AM

## 2014-08-09 LAB — URINE CULTURE: Colony Count: >=100000

## 2014-08-10 ENCOUNTER — Telehealth: Payer: Self-pay | Admitting: *Deleted

## 2014-08-10 DIAGNOSIS — I10 Essential (primary) hypertension: Secondary | ICD-10-CM | POA: Diagnosis not present

## 2014-08-10 DIAGNOSIS — N189 Chronic kidney disease, unspecified: Secondary | ICD-10-CM | POA: Diagnosis not present

## 2014-08-10 DIAGNOSIS — N400 Enlarged prostate without lower urinary tract symptoms: Secondary | ICD-10-CM | POA: Diagnosis not present

## 2014-08-10 DIAGNOSIS — N1330 Unspecified hydronephrosis: Secondary | ICD-10-CM | POA: Diagnosis not present

## 2014-08-10 DIAGNOSIS — IMO0001 Reserved for inherently not codable concepts without codable children: Secondary | ICD-10-CM | POA: Diagnosis not present

## 2014-08-10 NOTE — Telephone Encounter (Signed)
Message copied by Madolin Twaddle, Ardeth Perfect on Wed Aug 10, 2014 12:30 PM ------      Message from: Chari Manning A      Created: Tue Aug 09, 2014  2:28 PM       Let patient know her kidney function is slightly improving, but we will need to send him to a nephrologist. Anemia is improving ------

## 2014-08-10 NOTE — Telephone Encounter (Signed)
Patient informed about his kidney function is slightly improving; and anemia is improving, but he will need to be referred to a nephrologist. Informed patient the Nephrologist will contact him to schedule an appointment. Patient verbalized understanding.Vivia Birmingham, RN

## 2014-08-11 ENCOUNTER — Other Ambulatory Visit: Payer: Self-pay | Admitting: Urology

## 2014-08-11 DIAGNOSIS — N1330 Unspecified hydronephrosis: Secondary | ICD-10-CM | POA: Diagnosis not present

## 2014-08-11 DIAGNOSIS — R972 Elevated prostate specific antigen [PSA]: Secondary | ICD-10-CM | POA: Diagnosis not present

## 2014-08-11 DIAGNOSIS — N401 Enlarged prostate with lower urinary tract symptoms: Secondary | ICD-10-CM | POA: Diagnosis not present

## 2014-08-11 DIAGNOSIS — R339 Retention of urine, unspecified: Secondary | ICD-10-CM | POA: Diagnosis not present

## 2014-08-12 DIAGNOSIS — N189 Chronic kidney disease, unspecified: Secondary | ICD-10-CM | POA: Diagnosis not present

## 2014-08-12 DIAGNOSIS — I10 Essential (primary) hypertension: Secondary | ICD-10-CM | POA: Diagnosis not present

## 2014-08-12 DIAGNOSIS — N400 Enlarged prostate without lower urinary tract symptoms: Secondary | ICD-10-CM | POA: Diagnosis not present

## 2014-08-12 DIAGNOSIS — IMO0001 Reserved for inherently not codable concepts without codable children: Secondary | ICD-10-CM | POA: Diagnosis not present

## 2014-08-12 DIAGNOSIS — N1330 Unspecified hydronephrosis: Secondary | ICD-10-CM | POA: Diagnosis not present

## 2014-08-15 DIAGNOSIS — N189 Chronic kidney disease, unspecified: Secondary | ICD-10-CM | POA: Diagnosis not present

## 2014-08-15 DIAGNOSIS — IMO0001 Reserved for inherently not codable concepts without codable children: Secondary | ICD-10-CM | POA: Diagnosis not present

## 2014-08-15 DIAGNOSIS — N1330 Unspecified hydronephrosis: Secondary | ICD-10-CM | POA: Diagnosis not present

## 2014-08-15 DIAGNOSIS — I10 Essential (primary) hypertension: Secondary | ICD-10-CM | POA: Diagnosis not present

## 2014-08-15 DIAGNOSIS — N400 Enlarged prostate without lower urinary tract symptoms: Secondary | ICD-10-CM | POA: Diagnosis not present

## 2014-08-17 DIAGNOSIS — IMO0002 Reserved for concepts with insufficient information to code with codable children: Secondary | ICD-10-CM | POA: Diagnosis not present

## 2014-08-17 DIAGNOSIS — R972 Elevated prostate specific antigen [PSA]: Secondary | ICD-10-CM | POA: Diagnosis not present

## 2014-08-17 DIAGNOSIS — R339 Retention of urine, unspecified: Secondary | ICD-10-CM | POA: Diagnosis not present

## 2014-08-22 DIAGNOSIS — N400 Enlarged prostate without lower urinary tract symptoms: Secondary | ICD-10-CM | POA: Diagnosis not present

## 2014-08-22 DIAGNOSIS — N1330 Unspecified hydronephrosis: Secondary | ICD-10-CM | POA: Diagnosis not present

## 2014-08-22 DIAGNOSIS — IMO0001 Reserved for inherently not codable concepts without codable children: Secondary | ICD-10-CM | POA: Diagnosis not present

## 2014-08-22 DIAGNOSIS — I10 Essential (primary) hypertension: Secondary | ICD-10-CM | POA: Diagnosis not present

## 2014-08-22 DIAGNOSIS — N189 Chronic kidney disease, unspecified: Secondary | ICD-10-CM | POA: Diagnosis not present

## 2014-08-24 ENCOUNTER — Other Ambulatory Visit (HOSPITAL_COMMUNITY): Payer: Self-pay | Admitting: Urology

## 2014-08-24 DIAGNOSIS — C61 Malignant neoplasm of prostate: Secondary | ICD-10-CM

## 2014-09-12 ENCOUNTER — Encounter (HOSPITAL_COMMUNITY)
Admission: RE | Admit: 2014-09-12 | Discharge: 2014-09-12 | Disposition: A | Discharge status: Home / Self Care | Payer: Medicare Other | Source: Ambulatory Visit | Attending: Diagnostic Radiology | Admitting: Diagnostic Radiology

## 2014-09-12 ENCOUNTER — Encounter (HOSPITAL_COMMUNITY)
Admission: RE | Admit: 2014-09-12 | Discharge: 2014-09-12 | Disposition: A | Discharge status: Home / Self Care | Payer: Medicare Other | Source: Ambulatory Visit | Attending: Urology | Admitting: Urology

## 2014-09-12 DIAGNOSIS — Z85828 Personal history of other malignant neoplasm of skin: Secondary | ICD-10-CM | POA: Diagnosis not present

## 2014-09-12 DIAGNOSIS — C61 Malignant neoplasm of prostate: Secondary | ICD-10-CM | POA: Diagnosis not present

## 2014-09-12 MED ORDER — TECHNETIUM TC 99M MEDRONATE IV KIT
25.4000 | PACK | Freq: Once | INTRAVENOUS | Status: AC | PRN
  Administered 2014-09-12: 25.4 via INTRAVENOUS

## 2014-11-14 ENCOUNTER — Encounter: Payer: Self-pay | Admitting: Family Medicine

## 2014-11-14 ENCOUNTER — Ambulatory Visit: Payer: Medicare Other | Attending: Family Medicine | Admitting: Family Medicine

## 2014-11-14 VITALS — BP 165/82 | HR 64 | Temp 99.1°F | Resp 16 | Ht 66.0 in | Wt 164.0 lb

## 2014-11-14 DIAGNOSIS — N184 Chronic kidney disease, stage 4 (severe): Secondary | ICD-10-CM | POA: Diagnosis not present

## 2014-11-14 DIAGNOSIS — N138 Other obstructive and reflux uropathy: Secondary | ICD-10-CM | POA: Diagnosis not present

## 2014-11-14 DIAGNOSIS — I129 Hypertensive chronic kidney disease with stage 1 through stage 4 chronic kidney disease, or unspecified chronic kidney disease: Secondary | ICD-10-CM | POA: Diagnosis not present

## 2014-11-14 DIAGNOSIS — N401 Enlarged prostate with lower urinary tract symptoms: Secondary | ICD-10-CM | POA: Diagnosis not present

## 2014-11-14 DIAGNOSIS — I10 Essential (primary) hypertension: Secondary | ICD-10-CM

## 2014-11-14 DIAGNOSIS — C61 Malignant neoplasm of prostate: Secondary | ICD-10-CM | POA: Insufficient documentation

## 2014-11-14 DIAGNOSIS — N189 Chronic kidney disease, unspecified: Secondary | ICD-10-CM | POA: Diagnosis present

## 2014-11-14 LAB — COMPLETE METABOLIC PANEL WITH GFR
ALBUMIN: 3.7 g/dL (ref 3.5–5.2)
ALT: <8 U/L (ref 0–53)
AST: 12 U/L (ref 0–37)
Alkaline Phosphatase: 59 U/L (ref 39–117)
BILIRUBIN TOTAL: 0.4 mg/dL (ref 0.2–1.2)
BUN: 33 mg/dL — ABNORMAL HIGH (ref 6–23)
CO2: 28 meq/L (ref 19–32)
Calcium: 9.4 mg/dL (ref 8.4–10.5)
Chloride: 105 mEq/L (ref 96–112)
Creat: 2.21 mg/dL — ABNORMAL HIGH (ref 0.50–1.35)
GFR, EST NON AFRICAN AMERICAN: 26 mL/min — AB
GFR, Est African American: 30 mL/min — ABNORMAL LOW
GLUCOSE: 89 mg/dL (ref 70–99)
Potassium: 5.1 mEq/L (ref 3.5–5.3)
SODIUM: 143 meq/L (ref 135–145)
TOTAL PROTEIN: 7.2 g/dL (ref 6.0–8.3)

## 2014-11-14 MED ORDER — AMLODIPINE BESYLATE 5 MG PO TABS: 5.0000 mg | ORAL_TABLET | Freq: Every day | ORAL | Status: DC

## 2014-11-14 MED ORDER — AMLODIPINE BESYLATE 10 MG PO TABS: 5.0000 mg | ORAL_TABLET | Freq: Every day | ORAL | Status: DC

## 2014-11-14 NOTE — Progress Notes (Signed)
F/U Prostate cancer

## 2014-11-14 NOTE — Patient Instructions (Signed)
Mr. Italiano,  Thank you for coming in today. It was a pleasure meeting you. I look forward to being your primary doctor.   Please restart norvasc at 5 mg daily.  F/u in 2 weeks with the nurse for BP check. F/u in 4 weeks with me for HTN f/u.  Dr. Adrian Blackwater

## 2014-11-14 NOTE — Progress Notes (Signed)
   Subjective:    Patient ID: Austin Nicholson, male    DOB: 11-Feb-1928, 80 y.o.   MRN: [id number] CC: f/u chronic HTN, CKD HPI  80 yo M with chronic HTN, CKD, prostate cancer:  1. CHRONIC HYPERTENSION  Disease Monitoring  Blood pressure range: does not check   Chest pain: no   Dyspnea: no   Claudication: no   Medication compliance: yes  Medication Side Effects  Lightheadedness: no   Urinary frequency: no   Edema: no   Impotence: yes, but on hormone treatment for prostate cancer    Preventitive Healthcare:  Exercise: yes   Diet Pattern: regular 3 meals and rare snacks, eats at home mostly  Salt Restriction: yes   2. Prostate cancer: had undergone first injection of testosterone blocking treatment for prostate cancer. Plans is treatments ever 4-6 weeks. Reports improvement in ability to pass urine. Denies fever, chills, weight loss, back pain.   Soc Hx: non smoker  Review of Systems As per HPI  Denies HAv     Objective:   Physical Exam BP 165/82 mmHg  Pulse 64  Temp(Src) 99.1 F (37.3 C) (Oral)  Resp 16  Ht 5\' 6"  (1.676 m)  Wt 164 lb (74.39 kg)  BMI 26.48 kg/m2  SpO2 97%  BP Readings from Last 3 Encounters:  11/14/14 165/82  08/08/14 139/70  08/05/14 155/66  General appearance: alert, cooperative and no distress Lungs: clear to auscultation bilaterally Heart: regular rate and rhythm, S1, S2 normal, no murmur, click, rub or gallop Extremities: extremities normal, atraumatic, no cyanosis or edema       Assessment & Plan:

## 2014-11-15 DIAGNOSIS — C61 Malignant neoplasm of prostate: Secondary | ICD-10-CM | POA: Insufficient documentation

## 2014-11-15 DIAGNOSIS — N184 Chronic kidney disease, stage 4 (severe): Secondary | ICD-10-CM | POA: Insufficient documentation

## 2014-11-15 NOTE — Assessment & Plan Note (Signed)
A: BP above goal with CKD P: norvasc 5 mg daily  BMP

## 2014-11-15 NOTE — Assessment & Plan Note (Signed)
A; under active treatment P: Patient signed release of records form. Will obtain and review records.

## 2014-11-15 NOTE — Assessment & Plan Note (Signed)
A: chronic kidney disease in the setting of chronic HTN and outlet obstruction due to enlarged prostate and prostate cancer. P: norvasc 5 mg daily Close f/u with goal of avoiding over treatment and orthotasts

## 2014-11-18 ENCOUNTER — Ambulatory Visit: Payer: Medicare Other | Admitting: Radiation Oncology

## 2014-11-21 ENCOUNTER — Telehealth: Payer: Self-pay | Admitting: Medical Oncology

## 2014-11-21 NOTE — Telephone Encounter (Signed)
I called pt and spoke with his friend Earlie Raveling to introduce myself as the Prostate Nurse Navigator and the Coordinator of the Prostate [place]. Mrs. Soyla Dryer will be bringing the patient to his appointment. She states the patient has trouble with talking on the phone so she is calling back for him.  1. I confirmed with her that he is aware of his referral to the clinic.   2. I discussed the format of the clinic and the physicians he will be seeing that day.  3. I discussed where the clinic is located and how to contact me.  4. I confirmed his address and informed her I would be mailing a packet of information and forms to be completed. I asked him to bring them with him the day of his appointment.    She  voiced understanding of the above. I asked her  to call me if he has any questions or concerns regarding his appointments or the forms he needs to complete.   Cira Rue, RN, BSN, [place]  [phone] Fax [phone]

## 2014-11-21 NOTE — Telephone Encounter (Signed)
I called pt and left him a message to call me back regarding his appointment for the Prostate [place]. I briefly introduced myself and the reason I was calling. I also attempted to call his friend that will bring him ( Mrs. Jodi Marble) but I was unable to reach her due to no answer or answering machine.       Cira Rue, RN, BSN, [place]  [phone] Fax [phone]

## 2014-11-25 ENCOUNTER — Encounter: Payer: Self-pay | Admitting: Radiation Oncology

## 2014-11-25 NOTE — Progress Notes (Signed)
GU Location of Tumor / Histology: prostatic adenocarcinoma   If Prostate Cancer, Gleason Score is (4 + 5) and PSA is (300)  Austin Nicholson presented September 2015 with an elevated PSA and abnormal DRE.  Biopsies of prostate (if applicable) revealed:    Past/Anticipated interventions by urology, if any: prostate biopsy, initiation of androgen deprivation and tamsulosin in September and firmagon in October; referral to radiation oncology  Past/Anticipated interventions by medical oncology, if any: no  Weight changes, if any: no  Bowel/Bladder complaints, if any: yes, penile pain, wears chronic foley, hematuria   Nausea/Vomiting, if any: no  Pain issues, if any:    SAFETY ISSUES:  Prior radiation?   Pacemaker/ICD? no  Possible current pregnancy? no  Is the patient on methotrexate? no  Current Complaints / other details:  80 year old male. Widowed. Teaches piano and organ. Father had prostate cancer. 139 cc prostate volume.

## 2014-11-28 ENCOUNTER — Telehealth: Payer: Self-pay | Admitting: Family Medicine

## 2014-11-28 ENCOUNTER — Telehealth: Payer: Self-pay | Admitting: Medical Oncology

## 2014-11-28 NOTE — Telephone Encounter (Signed)
I spoke with patient to confirm his appointments for the Prostate [place] for 11/29/14 at 12:15am. I reminded him to bring his completed medical forms and to eat lunch. He is aware that I have spoken with Earlie Raveling- friend and transportation. He voiced understanding and I asked him to call if any questions concerning his appointments tomorrow.\       Cira Rue, RN, BSN, CRNI Prostate Nurse [place] Office: [phone] Fax: [phone]

## 2014-11-28 NOTE — Telephone Encounter (Signed)
I spoke with Earlie Raveling to confirm patient's appointment for 11/29/14 with arrival time of 12:15pm.I reminded her to have the patient bring his completed medical forms. I explained where the cancer center is and about the [place] parking. I asked her to make sure they have some lunch before they come due to the length of the appointment. She voiced understanding and I asked her to call me with any questions or concerns about tomorrow's appointment.     Cira Rue, RN, BSN, CRNI Prostate Nurse [place] Office: [phone] Fax: [phone]

## 2014-11-28 NOTE — Telephone Encounter (Signed)
Patient's friend called stating that the patient has to cancel his appointment for the nurse tomorrow to check his blood pressure because he has another conflicting appointment tomorrow also but she wanted to let the PCP know that the patient had his blood pressure checked at Dr.Eskridge's office at [facility] about a week ago and it was 132/80. Patient would like to know since his blood pressure was not high if he needs to reschedule that appointment..Please f/u.

## 2014-11-29 ENCOUNTER — Ambulatory Visit
Admission: RE | Admit: 2014-11-29 | Discharge: 2014-11-29 | Disposition: A | Discharge status: Home / Self Care | Payer: Medicare Other | Source: Ambulatory Visit | Attending: Radiation Oncology | Admitting: Radiation Oncology

## 2014-11-29 ENCOUNTER — Ambulatory Visit (HOSPITAL_BASED_OUTPATIENT_CLINIC_OR_DEPARTMENT_OTHER): Payer: Self-pay | Admitting: Oncology

## 2014-11-29 ENCOUNTER — Encounter: Payer: Self-pay | Admitting: Specialist

## 2014-11-29 ENCOUNTER — Encounter: Payer: Self-pay | Admitting: Radiation Oncology

## 2014-11-29 ENCOUNTER — Encounter: Payer: Self-pay | Admitting: Medical Oncology

## 2014-11-29 VITALS — BP 166/72 | HR 64 | Temp 97.4°F | Resp 16 | Ht 66.0 in | Wt 163.2 lb

## 2014-11-29 DIAGNOSIS — C61 Malignant neoplasm of prostate: Secondary | ICD-10-CM | POA: Insufficient documentation

## 2014-11-29 DIAGNOSIS — Z79899 Other long term (current) drug therapy: Secondary | ICD-10-CM | POA: Insufficient documentation

## 2014-11-29 HISTORY — DX: Malignant neoplasm of prostate: C61

## 2014-11-29 NOTE — Consult Note (Signed)
Reason for Referral: Prostate cancer  HPI: 80 year old gentleman native of [place] but currently lives in [place], [place]. He is a gentleman in reasonably good health without any significant comorbid conditions presented with acute renal failure in August 2015. At that time he was noted to have a creatinine of 4.8 and imaging studies showed bilateral hydronephrosis. His evaluation revealed a PSA of 300 and subsequently underwent a prostate biopsy. His biopsy revealed prostate cancer Gleason score 4+5 = 9 and the majority of 11 out of 12 cores. Imaging studies including a CT scan and a bone scan did not really show any evidence of advanced disease beyond the prostate. He was started on androgen deprivation in the form of [place] and currently has a catheter in place and under consideration for possible TURP. Other than his urinary difficulties, he is completely asymptomatic. He has not reported any headaches or blurry vision or syncope. He does not report any chest pain shortness of breath. He does not report any abdominal pain, back pain or arthralgias. He does not report any hematuria or dysuria. He does not report any decline in his energy her performance status. 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 Rest of his review of systems unremarkable.   Past Medical History  Diagnosis Date  . ARF (acute renal failure) 07/2014    with bilateral hydronephrosis   . Prostate cancer   . Cancer     basal cell carcinoma of forehead  :  Past Surgical History  Procedure Laterality Date  . Basal cell carcinoma excision    . Prostate biopsy    :   Current Outpatient Prescriptions  Medication Sig Dispense Refill  . amLODipine (NORVASC) 5 MG tablet Take 1 tablet (5 mg total) by mouth daily. 30 tablet 1  . calcium carbonate 1250 MG capsule Take 1,250 mg by mouth 2 (two) times daily with a meal.     No current facility-administered  medications for this visit.     No Known Allergies:  Family History  Problem Relation Age of Onset  . Cancer Father     prostate  :  History   Social History  . Marital Status: Widowed    Spouse Name: N/A    Number of Children: N/A  . Years of Education: N/A   Occupational History  . Not on file.   Social History Main Topics  . Smoking status: Never Smoker   . Smokeless tobacco: Never Used  . Alcohol Use: No  . Drug Use: No  . Sexual Activity: No   Other Topics Concern  . Not on file   Social History Narrative  :  Pertinent items are noted in HPI.  Exam: ECOG 0 There were no vitals taken for this visit. General appearance: alert and cooperative Head: Normocephalic, without obvious abnormality Throat: lips, mucosa, and tongue normal; teeth and gums normal Neck: no adenopathy Back: negative Resp: clear to auscultation bilaterally Cardio: regular rate and rhythm, S1, S2 normal, no murmur, click, rub or gallop GI: soft, non-tender; bowel sounds normal; no masses,  no organomegaly Extremities: extremities normal, atraumatic, no cyanosis or edema Pulses: 2+ and symmetric Skin: Skin color, texture, turgor normal. No rashes or lesions     Assessment and Plan:   80 year old gentleman presented with high risk prostate cancer with a PSA of 300 and a Gleason score 4+5 = 9. He had at least 11 out of 12  cores involved. His imaging studies did not show any evidence of distant metastasis. He is case was discussed today in the prostate cancer multidisciplinary clinic and options of treatment was discussed with the patient today. Other values reasonable to consider combined androgen deprivation with radiation therapy given his high-risk potentially locally advanced disease. I see no role for surgical therapy or chemotherapy at this time. As for his urinary retention, I think a TURP would help his urine flow and subsequently proceed with radiation therapy at that time. All his  questions were answered today to his satisfaction.

## 2014-11-29 NOTE — Progress Notes (Signed)
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 Also, his two grown sons are teachers and live out of town. Patient widowed. Patient does not have a car. Friend brought patient for appointment today. Patient denies that he has ever had a colonoscopy and does not perform routine testicular self exams. Patient reports fatigue, pain and weight changes. Patient wears glasses. Patient reports dribbling urine, incontinence, and hx of UTIs. IPSS form not completed by patient because he has a urinary catheter. Patient denies having a pacemaker or history of XRT.

## 2014-11-29 NOTE — Progress Notes (Signed)
[place] Psychosocial Distress Screening Clinical Social Work  Chaplain met patient in multidisciplinary clinic and assessed distress using distress screening protocol.  The patient scored a 1 on the Psychosocial Distress Thermometer which indicates mild distress.  Clinical Social Worker follow up needed: No.  Patient does not have a car and may need help with transportation. A friend brought him today. I provided contact information for the social workers and information about the [employer] and services, including transportation. Patient was also concerned about insurance. I referred him to the financial counselors. I also provided him the Spiritual Care contact information and offered support. 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 We talked about container gardening and I suggested that the [employer] and our horticulture therapist might be able to give him ideas about how to garden in small spaces.  Epifania Gore, PhD, [place] 11/29/2014  Screening Type Initial Screening  Distress experienced in past week (1-10) 1  Practical problem type Insurance;Transportation  Physician notified of physical symptoms Yes  Referral to clinical psychology No  Referral to clinical social work No  Referral to dietition No  Referral to financial advocate Yes  Referral to support programs Yes  Referral to palliative care No

## 2014-11-29 NOTE — [facility] Oncology Navigator Note (Signed)
                               Care Plan Summary  Name: Dr. [name] DOB: 1928/08/09   Your Medical Team:   Urologist -  Dr. Raynelle Bring, [facility]  Radiation Oncologist - Dr. [name], [facility]   Medical Oncologist - Dr. Zola Button, [place]  Recommendations: 1)  Continue Hormone Therapy- ADT 2)  Voiding Trial/TURP  3)  Radiation   * These recommendations are based on information available as of today's consult.      Recommendations may change depending on the results of further tests or exams. Next Steps: 1)  Dr. Lyndal Rainbow office will contact to set up appointment for voiding trial/TURP  2)  Dr. Johny Shears office will call you with to set up an appointment for radiation.   When appointments need to be scheduled, you will be contacted by [facility] and/or [facility].  Questions?  Please do not hesitate to call Cira Rue, RN, BSN, CRNI at [phone] any questions or concerns.  Shirlean Mylar is your Oncology Nurse Navigator and is available to assist you while you're receiving your medical care at [facility].

## 2014-11-29 NOTE — Consult Note (Signed)
Chief Complaint Prostate Cancer   Reason For Visit Reason for consult: To discuss treatment options for prostate cancer.  Physician requesting consult: Dr. Eda Keys  PCP: Chari Manning, [facility]  Location of consult: [place]   History of Present Illness Mr. Sabino is an 80 year old gentleman sent to the prostate cancer multidisciplinary clinic to discuss treatment options for prostate cancer. He was initially seen as a hospital consult in August 2015 by Dr. Junious Silk for bilateral hydroureteronephrosis after an admission for acute renal failure when his Cr was found to be 5.14. He had a catheter placed. A PSA was performed and was found to be about 300. On follow up, his DRE was very abnormal and concerning for locally advanced prostate cancer. He underwent a prostate needle biopsy on 08/17/14 that demonstrated Gleason 4+5=9 adenocarcinoma in 11 out of 12 biopsy cores. His CT scan from his hospital admission did indicate a slightly enlarged right common iliac lymph node at the level of the bifurcation. A bone scan on 09/12/14 was also negative for obvious metastatic disease. He began androgen deprivation on 09/22/14 with a subsequent decrease in his PSA to 47.2, He has now been placed on tamsulosin and is planned for a voiding trial in January. His Cr had declined to 2.2 with catheter drainage.     TNM stage: cT3a N0 M0  PSA: 299  Gleason score: 4+5=9  Prostate volume: 139 cc    ** He has no major medical comorbidities aside from his renal dysfunction and prostate cancer.   Past Medical History Problems  1. History of acute renal failure (Z87.448) 2. History of basal cell carcinoma (Z85.828)  Current Meds 1. Calcium TABS;  Therapy: (Recorded:11Nov2015) to Recorded  Allergies Medication  1. No Known Drug Allergies  Family History Problems  1. Family history of dementia (Z81.8) : Mother 2. Family history of pneumonia (Z83.1) : Father 3. Family history of  prostate cancer (Z80.42) : Father  Social History Problems    Denied: History of Alcohol use   Never a smoker   Occupation   Immunologist   Widowed  Review of Systems Constitutional, skin, eye, otolaryngeal, hematologic/lymphatic, cardiovascular, pulmonary, endocrine, musculoskeletal, gastrointestinal, neurological and psychiatric system(s) were reviewed and pertinent findings if present are noted and are otherwise negative.    Physical Exam Constitutional: Well nourished and well developed . No acute distress.  Genitourinary: Examination of the penis demonstrates an indwelling catheter.    Assessment Assessed  1. Prostate cancer (C61)  Discussion/Summary 1. High risk prostate cancer: I had a long and detailed discussion with Mr. Mcmurtry and his friend, Mrs. Soyla Dryer, today. We discussed the nature of his prostate cancer and its aggressive behavior. We discussed the natural history of prostate cancer and the potential treatment options including the potential quality of life side effects associated with available options. We did discuss the review of his imaging studies which did indicate a likely pelvic lymph node concerning for regional disease. I have discussed the situation in detail with Dr. Alen Blew and Dr. Tammi Klippel. It is the consensus of the multidisciplinary group that he continue with androgen deprivation therapy with plans to proceed with pelvic radiation. However, he still has urinary retention which needs to be addressed. He has been on alpha-blocker therapy and androgen deprivation therapy and is apparently scheduled for a voiding trial in January with Dr. Junious Silk. If he is unable to pass a voiding trial, he may be best served with a TURP prior to  radiation therapy in order to provide the best chance for his voiding function long-term. I answered numerous questions for Mr. Nee today and he feels quite comfortable with this approach.    Cc: Dr. Zola Button  Dr.  [name]  Dr. Festus Aloe  Chari Manning, [facility]   A total of 35 minutes were spent in the overall care of the patient today with 35 minutes in direct face to face consultation.    Signatures Electronically signed by : Raynelle Bring, M.D.; Nov 29 2014  3:24PM EST

## 2014-11-29 NOTE — Progress Notes (Signed)
Radiation Oncology         [phone] ________________________________  Multidisciplinary Prostate Cancer Clinic  Initial Radiation Oncology Consultation  Name: Austin Nicholson MRN: [id number]  Date: 11/29/2014  DOB: 13-Oct-1928  IR:[id number], Lennox Laity, MD  Raynelle Bring, MD   REFERRING PHYSICIAN: Raynelle Bring, MD  DIAGNOSIS: 80 y.o. 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 Prostate cancer [place] is a 80 y.o. gentleman.  He presented to the emergency department on 08/01/2014 complaining of urinary incontinence. He was found have a creatinine of 5.14 and CT showed pronounced bladder distention causing severe hydronephrosis with enlargement of the prostate. The patient was admitted for further management and PSA was 299. The patient had a Foley catheter placed with improvement in creatinine and was discharged from the hospital. He followed up in urology with Dr. Junious Silk and digital rectal examination was performed at that time revealing a diffusely nodular firm prostate suspicious for malignancy.  The patient proceeded to transrectal ultrasound with 12 biopsies of the prostate on 08/17/14.  The prostate volume measured 137 mL.  Out of 12 core biopsies, 11 were positive.  The maximum Gleason score was 4+5.  The patient reviewed the biopsy results with his urologist.  Bone scan was done showing no metastases.  The patient did not undergo dedicated staging CT since one had been performed in the ED.  The initial report described no lymphadenopathy, but, this is being re-reviewed in light of the interval diagnosis of prostate cancer.  He has been started on hormone therapy and he has kindly been referred today to the multidisciplinary prostate cancer clinic for presentation of pathology and radiology studies in our conference for discussion of potential  radiation treatment options and clinical evaluation.   PREVIOUS RADIATION THERAPY: No  PAST MEDICAL HISTORY:  has a past medical history of ARF (acute renal failure) (07/2014); Prostate cancer; and Cancer.    PAST SURGICAL HISTORY: Past Surgical History  Procedure Laterality Date  . Basal cell carcinoma excision    . Prostate biopsy      FAMILY HISTORY: family history includes Cancer in his father.  SOCIAL HISTORY:  reports that he has never smoked. He has never used smokeless tobacco. He reports that he does not drink alcohol or use illicit drugs.  ALLERGIES: Review of patient's allergies indicates no known allergies.  MEDICATIONS:  Current Outpatient Prescriptions  Medication Sig Dispense Refill  . amLODipine (NORVASC) 5 MG tablet Take 1 tablet (5 mg total) by mouth daily. 30 tablet 1  . calcium carbonate 1250 MG capsule Take 1,250 mg by mouth 2 (two) times daily with a meal.     No current facility-administered medications for this encounter.    REVIEW OF SYSTEMS:  A 15 point review of systems is documented in the electronic medical record. This was obtained by the nursing staff. However, I reviewed this with the patient to discuss relevant findings and make appropriate changes.  Pertinent items are noted in HPI..  The patient completed an IPSS and IIEF questionnaire.     PHYSICAL EXAM: This patient is in no acute distress.  He is alert and oriented.   height is 5\' 6"  (1.676 m) and weight is 163 lb 3.2 oz (74.027 kg). His oral temperature is 97.4 F (36.3 C). His blood pressure is 166/72 and his pulse  is 64. His respiration is 16 and oxygen saturation is 100%.  He exhibits no respiratory distress or labored breathing.  He appears neurologically intact.  His mood is pleasant.  His affect is appropriate.  Please note the digital rectal exam findings described above.  KPS = 80  100 - Normal; no complaints; no evidence of disease. 90   - Able to carry on normal activity; minor  signs or symptoms of disease. 80   - Normal activity with effort; some signs or symptoms of disease. 67   - Cares for self; unable to carry on normal activity or to do active work. 60   - Requires occasional assistance, but is able to care for most of his personal needs. 50   - Requires considerable assistance and frequent medical care. 64   - Disabled; requires special care and assistance. 49   - Severely disabled; hospital admission is indicated although death not imminent. 6   - Very sick; hospital admission necessary; active supportive treatment necessary. 10   - Moribund; fatal processes progressing rapidly. 0     - Dead  Karnofsky DA, Abelmann [place], Craver LS and Burchenal [facility] [place] [phone]) The use of the nitrogen mustards in the palliative treatment of carcinoma: with particular reference to bronchogenic carcinoma Cancer 1 [id number]   LABORATORY DATA:  Lab Results  Component Value Date   WBC 8.0 08/08/2014   HGB 11.2* 08/08/2014   HCT 32.4* 08/08/2014   MCV 82.4 08/08/2014   PLT 359 08/08/2014   Lab Results  Component Value Date   NA 143 11/14/2014   K 5.1 11/14/2014   CL 105 11/14/2014   CO2 28 11/14/2014   Lab Results  Component Value Date   ALT <8 11/14/2014   AST 12 11/14/2014   ALKPHOS 59 11/14/2014   BILITOT 0.4 11/14/2014     RADIOGRAPHY: No results found.    IMPRESSION: This gentleman is a 80 y.o. 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  His T-Stage, Gleason's Score, and PSA put him into the high risk group.  Accordingly he is eligible for a variety of potential treatment options including androgen deprivation for 2-3 years with radiotherapy.  PLAN:Today I reviewed the findings and workup thus far.  We discussed the natural history of prostate cancer.  We reviewed the the implications of T-stage, Gleason's Score, and PSA on decision-making and outcomes in prostate cancer.  We discussed radiation treatment in  the management of prostate cancer with regard to the logistics and delivery of external beam radiation treatment as well as the logistics and delivery of prostate brachytherapy.  We compared and contrasted each of these approaches and also compared these against prostatectomy.  The patient expressed interest in external beam radiotherapy.  I filled out a patient counseling form for him with relevant treatment diagrams and we retained a copy for our records.   The patient would like to proceed with prostate IMRT.  I will share my findings with Dr. Alinda Money  However, prior to radiation treatment, the patient may require a voiding trial for possible TURP, unless hormone therapy has provided some relief of his urinary obstructive symptoms.   I enjoyed meeting with him today, and will look forward to participating in the care of this very nice gentleman.  I spent 60 minutes face to face with the patient and more than 50% of that time was spent in counseling and/or coordination of care.   ------------------------------------------------  Sheral Apley Tammi Klippel, M.D.

## 2014-11-29 NOTE — Progress Notes (Signed)
Please see consult note.  

## 2014-11-30 NOTE — Telephone Encounter (Signed)
-----   Message from Minerva Ends, MD sent at 11/15/2014  9:01 AM EST ----- Elevated yet improved Cr in the setting of CKD.  Plan tight BP control, avoid OTC and Rx nephrotoxic drugs (NSAIDs etc.).

## 2014-11-30 NOTE — Telephone Encounter (Signed)
Pt aware of results Advised continue taking BP medication, and F/U for BP check in two weeks

## 2014-12-26 ENCOUNTER — Encounter: Payer: Self-pay | Admitting: Medical Oncology

## 2014-12-26 NOTE — [facility] Oncology Navigator Note (Signed)
Pt's friend Mrs. Soyla Dryer called with concerns about patient's insurance. He currently has Medicare only.She states he needs to get a supplemental policy and wondered if we can help him to this.The bills are beginning to come in and he needs help getting them pain. She states that Mr. Marrazzo filed for Medicaid thru [employer] in [place]. I explained that I do not know much about the insurance process but I will do my best to try and get her answers. I spoke with Jodelle Green and she states that Mrs. Soyla Dryer needs to call back to [facility] to see if patient has been approved. If and when he gets approved that will back pay 90 days. I called Mrs. Soyla Dryer back to give her this information. If patient gets approved he will not need to apply for a supplemental policy. She will give me a call as soon as she she speaks with the [facility] office.    Cira Rue, RN, BSN, [place]  [phone] Fax [phone]

## 2014-12-27 DIAGNOSIS — R339 Retention of urine, unspecified: Secondary | ICD-10-CM | POA: Diagnosis not present

## 2014-12-27 DIAGNOSIS — C61 Malignant neoplasm of prostate: Secondary | ICD-10-CM | POA: Diagnosis not present

## 2015-01-04 ENCOUNTER — Encounter: Payer: Self-pay | Admitting: Medical Oncology

## 2015-01-04 NOTE — [facility] Oncology Navigator Note (Signed)
I called pt's friend Mrs. Soyla Dryer and left a message requesting a call back. Pt had applied for Medicaid but as of 1/18 he had not heard if he was approved. Mr. Austin Nicholson also had an appointment with Dr. Junious Silk and I wanted to follow up on his visit and where we are regarding his voiding trial.    Cira Rue, RN, BSN, [place]  [phone] Fax [phone]

## 2015-01-05 ENCOUNTER — Encounter: Payer: Self-pay | Admitting: Medical Oncology

## 2015-01-05 NOTE — [facility] Oncology Navigator Note (Signed)
I called [name]- patient's friend to follow up on his medicaid application. I have not been able to reach her so I called the patient. Mr. Austin Nicholson states he is not sure if he has gotten approved or where he is in the process. He states I need to speak with his friend Austin Nicholson. I asked if still has his catheter and he states yes. I asked if he has had the voiding trial and he does not think so but thinks it is scheduled next month.  He did inform me that Austin Nicholson had an accident and sprained her ankle so this has slowed her down. He is going to ask her to call me.   Austin Rue, RN, BSN, [place]  [phone]  Fax [phone]

## 2015-01-31 ENCOUNTER — Telehealth: Payer: Self-pay | Admitting: Medical Oncology

## 2015-01-31 DIAGNOSIS — C61 Malignant neoplasm of prostate: Secondary | ICD-10-CM | POA: Diagnosis not present

## 2015-01-31 DIAGNOSIS — R339 Retention of urine, unspecified: Secondary | ICD-10-CM | POA: Diagnosis not present

## 2015-01-31 DIAGNOSIS — N390 Urinary tract infection, site not specified: Secondary | ICD-10-CM | POA: Diagnosis not present

## 2015-01-31 NOTE — Telephone Encounter (Signed)
Angelica Pou called to update me on Mr. [facility] application. She states she is sorry is has taken so long to call me back but she just found out about the application form. She has called the [facility] program and left multiple messages without any return calls. She finally reached someone and Mr. Rhetta Mura application was mailed back to him months ago because he did not sign it. The patient is not sure if he got it and if he did he does not know where it is. So, he has reapply. She states that she is very discouraged with the system but she will take him back to reapply. She feels he is eligible but just needs to get the application processed. She will keep me posted on the process. She has my office number and I asked her to call me if I can be of any assistance and she voiced understanding.    Cira Rue, RN, BSN, [place]  [phone] Fax [phone]

## 2015-03-01 DIAGNOSIS — N138 Other obstructive and reflux uropathy: Secondary | ICD-10-CM | POA: Diagnosis not present

## 2015-03-01 DIAGNOSIS — C61 Malignant neoplasm of prostate: Secondary | ICD-10-CM | POA: Diagnosis not present

## 2015-03-01 DIAGNOSIS — R339 Retention of urine, unspecified: Secondary | ICD-10-CM | POA: Diagnosis not present

## 2015-03-01 DIAGNOSIS — N401 Enlarged prostate with lower urinary tract symptoms: Secondary | ICD-10-CM | POA: Diagnosis not present

## 2015-03-19 ENCOUNTER — Emergency Department (HOSPITAL_COMMUNITY)
Admission: EM | Admit: 2015-03-19 | Discharge: 2015-03-19 | Disposition: A | Discharge status: Home / Self Care | Payer: Medicare Other | Attending: Emergency Medicine | Admitting: Emergency Medicine

## 2015-03-19 ENCOUNTER — Encounter (HOSPITAL_COMMUNITY): Payer: Self-pay | Admitting: Emergency Medicine

## 2015-03-19 DIAGNOSIS — T83028A Displacement of other indwelling urethral catheter, initial encounter: Secondary | ICD-10-CM | POA: Diagnosis not present

## 2015-03-19 DIAGNOSIS — I10 Essential (primary) hypertension: Secondary | ICD-10-CM | POA: Insufficient documentation

## 2015-03-19 DIAGNOSIS — T839XXA Unspecified complication of genitourinary prosthetic device, implant and graft, initial encounter: Secondary | ICD-10-CM

## 2015-03-19 DIAGNOSIS — Z87448 Personal history of other diseases of urinary system: Secondary | ICD-10-CM | POA: Diagnosis not present

## 2015-03-19 DIAGNOSIS — N508 Other specified disorders of male genital organs: Secondary | ICD-10-CM | POA: Diagnosis not present

## 2015-03-19 DIAGNOSIS — T83098A Other mechanical complication of other indwelling urethral catheter, initial encounter: Secondary | ICD-10-CM | POA: Diagnosis not present

## 2015-03-19 DIAGNOSIS — Z8546 Personal history of malignant neoplasm of prostate: Secondary | ICD-10-CM | POA: Insufficient documentation

## 2015-03-19 DIAGNOSIS — Y846 Urinary catheterization as the cause of abnormal reaction of the patient, or of later complication, without mention of misadventure at the time of the procedure: Secondary | ICD-10-CM | POA: Diagnosis not present

## 2015-03-19 DIAGNOSIS — Z85828 Personal history of other malignant neoplasm of skin: Secondary | ICD-10-CM | POA: Insufficient documentation

## 2015-03-19 DIAGNOSIS — Z79899 Other long term (current) drug therapy: Secondary | ICD-10-CM | POA: Insufficient documentation

## 2015-03-19 HISTORY — DX: Essential (primary) hypertension: I10

## 2015-03-19 LAB — URINALYSIS, ROUTINE W REFLEX MICROSCOPIC
Bilirubin Urine: NEGATIVE
Glucose, UA: NEGATIVE mg/dL
Ketones, ur: NEGATIVE mg/dL
Nitrite: POSITIVE — AB
PH: 6 (ref 5.0–8.0)
Protein, ur: NEGATIVE mg/dL
SPECIFIC GRAVITY, URINE: 1.013 (ref 1.005–1.030)
UROBILINOGEN UA: 0.2 mg/dL (ref 0.0–1.0)

## 2015-03-19 LAB — BASIC METABOLIC PANEL
ANION GAP: 7 (ref 5–15)
BUN: 38 mg/dL — ABNORMAL HIGH (ref 6–23)
CO2: 26 mmol/L (ref 19–32)
CREATININE: 1.93 mg/dL — AB (ref 0.50–1.35)
Calcium: 8.4 mg/dL (ref 8.4–10.5)
Chloride: 106 mmol/L (ref 96–112)
GFR calc non Af Amer: 30 mL/min — ABNORMAL LOW (ref >90)
GFR, EST AFRICAN AMERICAN: 35 mL/min — AB (ref >90)
Glucose, Bld: 99 mg/dL (ref 70–99)
POTASSIUM: 4.1 mmol/L (ref 3.5–5.1)
Sodium: 139 mmol/L (ref 135–145)

## 2015-03-19 LAB — CBC WITH DIFFERENTIAL/PLATELET
BASOS PCT: 1 % (ref 0–1)
Basophils Absolute: 0.1 10*3/uL (ref 0.0–0.1)
EOS PCT: 1 % (ref 0–5)
Eosinophils Absolute: 0.1 10*3/uL (ref 0.0–0.7)
HCT: 31.9 % — ABNORMAL LOW (ref 39.0–52.0)
HEMOGLOBIN: 10.8 g/dL — AB (ref 13.0–17.0)
Lymphocytes Relative: 34 % (ref 12–46)
Lymphs Abs: 2.7 10*3/uL (ref 0.7–4.0)
MCH: 29.4 pg (ref 26.0–34.0)
MCHC: 33.9 g/dL (ref 30.0–36.0)
MCV: 86.9 fL (ref 78.0–100.0)
MONO ABS: 0.5 10*3/uL (ref 0.1–1.0)
MONOS PCT: 7 % (ref 3–12)
NEUTROS PCT: 57 % (ref 43–77)
Neutro Abs: 4.5 10*3/uL (ref 1.7–7.7)
Platelets: 215 10*3/uL (ref 150–400)
RBC: 3.67 MIL/uL — AB (ref 4.22–5.81)
RDW: 12.7 % (ref 11.5–15.5)
WBC: 7.9 10*3/uL (ref 4.0–10.5)

## 2015-03-19 LAB — URINE MICROSCOPIC-ADD ON

## 2015-03-19 MED ORDER — CEPHALEXIN 500 MG PO CAPS: 500.0000 mg | ORAL_CAPSULE | Freq: Four times a day (QID) | ORAL | Status: DC

## 2015-03-19 MED ORDER — ONDANSETRON HCL 4 MG/2ML IJ SOLN: 4.0000 mg | Freq: Once | INTRAMUSCULAR | Status: DC

## 2015-03-19 MED ORDER — SODIUM CHLORIDE 0.9 % IV BOLUS (SEPSIS): 1000.0000 mL | Freq: Once | INTRAVENOUS | Status: DC

## 2015-03-19 MED ORDER — MORPHINE SULFATE 4 MG/ML IJ SOLN: 4.0000 mg | Freq: Once | INTRAMUSCULAR | Status: DC

## 2015-03-19 NOTE — Discharge Instructions (Signed)
Foley Catheter Care A Foley catheter is a soft, flexible tube that is placed into the bladder to drain urine. A Foley catheter may be inserted if:  You leak urine or are not able to control when you urinate (urinary incontinence).  You are not able to urinate when you need to (urinary retention).  You had prostate surgery or surgery on the genitals.  You have certain medical conditions, such as multiple sclerosis, dementia, or a spinal cord injury. If you are going home with a Foley catheter in place, follow the instructions below. TAKING CARE OF THE CATHETER  Wash your hands with soap and water.  Using mild soap and warm water on a clean washcloth:  Clean the area on your body closest to the catheter insertion site using a circular motion, moving away from the catheter. Never wipe toward the catheter because this could sweep bacteria up into the urethra and cause infection.  Remove all traces of soap. Pat the area dry with a clean towel. For males, reposition the foreskin.  Attach the catheter to your leg so there is no tension on the catheter. Use adhesive tape or a leg strap. If you are using adhesive tape, remove any sticky residue left behind by the previous tape you used.  Keep the drainage bag below the level of the bladder, but keep it off the floor.  Check throughout the day to be sure the catheter is working and urine is draining freely. Make sure the tubing does not become kinked.  Do not pull on the catheter or try to remove it. Pulling could damage internal tissues. TAKING CARE OF THE DRAINAGE BAGS You will be given two drainage bags to take home. One is a large overnight drainage bag, and the other is a smaller leg bag that fits underneath clothing. You may wear the overnight bag at any time, but you should never wear the smaller leg bag at night. Follow the instructions below for how to empty, change, and clean your drainage bags. Emptying the Drainage Bag You must empty  your drainage bag when it is  - full or at least 2-3 times a day.  Wash your hands with soap and water.  Keep the drainage bag below your hips, below the level of your bladder. This stops urine from going back into the tubing and into your bladder.  Hold the dirty bag over the toilet or a clean container.  Open the pour spout at the bottom of the bag and empty the urine into the toilet or container. Do not let the pour spout touch the toilet, container, or any other surface. Doing so can place bacteria on the bag, which can cause an infection.  Clean the pour spout with a gauze pad or cotton ball that has rubbing alcohol on it.  Close the pour spout.  Attach the bag to your leg with adhesive tape or a leg strap.  Wash your hands well. Changing the Drainage Bag Change your drainage bag once a month or sooner if it starts to smell bad or look dirty. Below are steps to follow when changing the drainage bag.  Wash your hands with soap and water.  Pinch off the rubber catheter so that urine does not spill out.  Disconnect the catheter tube from the drainage tube at the connection valve. Do not let the tubes touch any surface.  Clean the end of the catheter tube with an alcohol wipe. Use a different alcohol wipe to clean the  end of the drainage tube.  Connect the catheter tube to the drainage tube of the clean drainage bag.  Attach the new bag to the leg with adhesive tape or a leg strap. Avoid attaching the new bag too tightly.  Wash your hands well. Cleaning the Drainage Bag 1. Wash your hands with soap and water. 2. Wash the bag in warm, soapy water. 3. Rinse the bag thoroughly with warm water. 4. Fill the bag with a solution of white vinegar and water (1 cup vinegar to 1 qt warm water [.2 L vinegar to 1 L warm water]). Close the bag and soak it for 30 minutes in the solution. 5. Rinse the bag with warm water. 6. Hang the bag to dry with the pour spout open and hanging  downward. 7. Store the clean bag (once it is dry) in a clean plastic bag. 8. Wash your hands well. PREVENTING INFECTION  Wash your hands before and after handling your catheter.  Take showers daily and wash the area where the catheter enters your body. Do not take baths. Replace wet leg straps with dry ones, if this applies.  Do not use powders, sprays, or lotions on the genital area. Only use creams, lotions, or ointments as directed by your caregiver.  For females, wipe from front to back after each bowel movement.  Drink enough fluids to keep your urine clear or pale yellow unless you have a fluid restriction.  Do not let the drainage bag or tubing touch or lie on the floor.  Wear cotton underwear to absorb moisture and to keep your skin drier. SEEK MEDICAL CARE IF:   Your urine is cloudy or smells unusually bad.  Your catheter becomes clogged.  You are not draining urine into the bag or your bladder feels full.  Your catheter starts to leak. SEEK IMMEDIATE MEDICAL CARE IF:   You have pain, swelling, redness, or pus where the catheter enters the body.  You have pain in the abdomen, legs, lower back, or bladder.  You have a fever.  You see blood fill the catheter, or your urine is pink or red.  You have nausea, vomiting, or chills.  Your catheter gets pulled out. MAKE SURE YOU:   Understand these instructions.  Will watch your condition.  Will get help right away if you are not doing well or get worse. Document Released: 11/25/2005 Document Revised: 04/11/2014 Document Reviewed: 11/16/2012 [facility] Patient Information 2015 [place], [place]. This information is not intended to replace advice given to you by your health care provider. Make sure you discuss any questions you have with your health care provider. Urinary Tract Infection Urinary tract infections (UTIs) can develop anywhere along your urinary tract. Your urinary tract is your body's drainage system for  removing wastes and extra water. Your urinary tract includes two kidneys, two ureters, a bladder, and a urethra. Your kidneys are a pair of bean-shaped organs. Each kidney is about the size of your fist. They are located below your ribs, one on each side of your spine. CAUSES Infections are caused by microbes, which are microscopic organisms, including fungi, viruses, and bacteria. These organisms are so small that they can only be seen through a microscope. Bacteria are the microbes that most commonly cause UTIs. SYMPTOMS  Symptoms of UTIs may vary by age and gender of the patient and by the location of the infection. Symptoms in young women typically include a frequent and intense urge to urinate and a painful, burning feeling  in the bladder or urethra during urination. Older women and men are more likely to be tired, shaky, and weak and have muscle aches and abdominal pain. A fever may mean the infection is in your kidneys. Other symptoms of a kidney infection include pain in your back or sides below the ribs, nausea, and vomiting. DIAGNOSIS To diagnose a UTI, your caregiver will ask you about your symptoms. Your caregiver also will ask to provide a urine sample. The urine sample will be tested for bacteria and white blood cells. White blood cells are made by your body to help fight infection. TREATMENT  Typically, UTIs can be treated with medication. Because most UTIs are caused by a bacterial infection, they usually can be treated with the use of antibiotics. The choice of antibiotic and length of treatment depend on your symptoms and the type of bacteria causing your infection. HOME CARE INSTRUCTIONS  If you were prescribed antibiotics, take them exactly as your caregiver instructs you. Finish the medication even if you feel better after you have only taken some of the medication.  Drink enough water and fluids to keep your urine clear or pale yellow.  Avoid caffeine, tea, and carbonated  beverages. They tend to irritate your bladder.  Empty your bladder often. Avoid holding urine for long periods of time.  Empty your bladder before and after sexual intercourse.  After a bowel movement, women should cleanse from front to back. Use each tissue only once. SEEK MEDICAL CARE IF:   You have back pain.  You develop a fever.  Your symptoms do not begin to resolve within 3 days. SEEK IMMEDIATE MEDICAL CARE IF:   You have severe back pain or lower abdominal pain.  You develop chills.  You have nausea or vomiting.  You have continued burning or discomfort with urination. MAKE SURE YOU:   Understand these instructions.  Will watch your condition.  Will get help right away if you are not doing well or get worse. Document Released: 09/04/2005 Document Revised: 05/26/2012 Document Reviewed: 01/03/2012 [facility] Patient Information 2015 [place], [place]. This information is not intended to replace advice given to you by your health care provider. Make sure you discuss any questions you have with your health care provider.

## 2015-03-19 NOTE — ED Notes (Signed)
Pt's wife states that pt has prostate cancer and last night his foley cath came out, un certain on how/why.

## 2015-03-19 NOTE — ED Provider Notes (Signed)
CSN: [id number]     Arrival date & time 03/19/15  1446 History   First MD Initiated Contact with Patient 03/19/15 1525     Chief Complaint  Patient presents with  . foley catheter came out    Austin Nicholson is a 81 y.o. male with a history of prostate cancer to presents to the ED reporting that his Foley catheter came out while trying to open it last night. The patient reports she is followed by Dr. Festus Aloe from urology and Dr. Tammi Klippel from oncology. He reports he is had an indwelling foley catheter. He reports being able to urinate a small amount without his Foley catheter since last night. He reports he has an appointment with his urologist in 3 days. He is requesting his foley cath be placed back. He denies fevers, abdominal pain, dysuria, hematuria, rectal pain, or abdominal pain.   (Consider location/radiation/quality/duration/timing/severity/associated sxs/prior Treatment) HPI  Past Medical History  Diagnosis Date  . ARF (acute renal failure) 07/2014    with bilateral hydronephrosis   . Prostate cancer   . Cancer     basal cell carcinoma of forehead  . Hypertension    Past Surgical History  Procedure Laterality Date  . Basal cell carcinoma excision    . Prostate biopsy     Family History  Problem Relation Age of Onset  . Cancer Father     prostate   History  Substance Use Topics  . Smoking status: Never Smoker   . Smokeless tobacco: Never Used  . Alcohol Use: No    Review of Systems  Constitutional: Negative for fever and chills.  HENT: Negative for congestion and sore throat.   Eyes: Negative for visual disturbance.  Respiratory: Negative for cough, shortness of breath and wheezing.   Cardiovascular: Negative for chest pain and palpitations.  Gastrointestinal: Negative for nausea, vomiting, abdominal pain and diarrhea.  Genitourinary: Negative for dysuria, urgency, hematuria, flank pain, discharge, penile swelling, scrotal swelling, genital sores, penile  pain and testicular pain.  Musculoskeletal: Negative for back pain and neck pain.  Skin: Negative for rash.  Neurological: Negative for headaches.      Allergies  Review of patient's allergies indicates no known allergies.  Home Medications   Prior to Admission medications   Medication Sig Start Date End Date Taking? Authorizing Provider  amLODipine (NORVASC) 5 MG tablet Take 1 tablet (5 mg total) by mouth daily. Patient not taking: Reported on 03/19/2015 11/14/14   Boykin Nearing, MD  calcium carbonate 1250 MG capsule Take 1,250 mg by mouth 2 (two) times daily with a meal.    Historical Provider, MD  cephALEXin (KEFLEX) 500 MG capsule Take 1 capsule (500 mg total) by mouth 4 (four) times daily. 03/19/15   Waynetta Pean, PA-C   BP 152/70 mmHg  Pulse 70  Temp(Src) 98.3 F (36.8 C) (Oral)  Resp 17  SpO2 99% Physical Exam  Constitutional: He appears well-developed and well-nourished. No distress.  HENT:  Head: Normocephalic and atraumatic.  Mouth/Throat: Oropharynx is clear and moist.  Eyes: Conjunctivae are normal. Pupils are equal, round, and reactive to light. Right eye exhibits no discharge. Left eye exhibits no discharge.  Neck: Neck supple.  Cardiovascular: Normal rate, regular rhythm, normal heart sounds and intact distal pulses.  Exam reveals no gallop and no friction rub.   No murmur heard. Pulmonary/Chest: Effort normal and breath sounds normal. No respiratory distress. He has no wheezes. He has no rales.  Abdominal: Soft. Bowel sounds are normal.  He exhibits no distension and no mass. There is no tenderness. There is no rebound and no guarding.  Abdomen soft and nontender to palpation.  Genitourinary: Penis normal. Right testis shows no swelling and no tenderness. Left testis shows no swelling and no tenderness. Uncircumcised. No penile tenderness.  GU exam performed by me with male RN chaperone. New Foley catheter is in place. No penile or scrotal tenderness. No  rashes or lesions noted.  Musculoskeletal: He exhibits no edema.  Lymphadenopathy:    He has no cervical adenopathy.  Neurological: He is alert. Coordination normal.  Skin: Skin is warm and dry. No rash noted. He is not diaphoretic. No erythema. No pallor.  Psychiatric: He has a normal mood and affect. His behavior is normal.  Nursing note and vitals reviewed.   ED Course  Procedures (including critical care time) Labs Review Labs Reviewed  URINALYSIS, ROUTINE W REFLEX MICROSCOPIC - Abnormal; Notable for the following:    APPearance TURBID (*)    Hgb urine dipstick MODERATE (*)    Nitrite POSITIVE (*)    Leukocytes, UA LARGE (*)    All other components within normal limits  BASIC METABOLIC PANEL - Abnormal; Notable for the following:    BUN 38 (*)    Creatinine, Ser 1.93 (*)    GFR calc non Af Amer 30 (*)    GFR calc Af Amer 35 (*)    All other components within normal limits  CBC WITH DIFFERENTIAL/PLATELET - Abnormal; Notable for the following:    RBC 3.67 (*)    Hemoglobin 10.8 (*)    HCT 31.9 (*)    All other components within normal limits  URINE MICROSCOPIC-ADD ON - Abnormal; Notable for the following:    Bacteria, UA MANY (*)    All other components within normal limits  URINE CULTURE    Imaging Review No results found.   EKG Interpretation None      Filed Vitals:   03/19/15 1451 03/19/15 1704  BP: 144/57 152/70  Pulse: 73 70  Temp: 98.3 F (36.8 C)   TempSrc: Oral   Resp: 18 17  SpO2: 100% 99%     MDM   Meds given in ED:  Medications - No data to display  New Prescriptions   CEPHALEXIN (KEFLEX) 500 MG CAPSULE    Take 1 capsule (500 mg total) by mouth 4 (four) times daily.    Final diagnoses:  Foley catheter problem, initial encounter   This is an 81 year old male with a history of prostate cancer who presents to the emergency department after his Foley catheter came out while he was trying to open. He is requesting this to be placed back in.  The patient denies any fevers, dysuria or hematuria. The patient reports that he has been able to urinate a small amount since his Foley catheter came out last night. The patient is afebrile and nontoxic appearing. His abdomen is soft and nontender to palpation. GU exam is unremarkable. His urinalysis is nitrite positive with large leukocytes and too numerous to count white blood cells. After discussing with Dr. Rulon Abide we will treat this with Keflex and sent for culture. The patient does not have a white count on CBC. His BMP indicates an improvement creatinine of 1.93 today. Advised patient to keep his appointment with his urologist Dr. Junious Silk on Wednesday. I advised the patient to follow-up with their primary care provider this week. I advised the patient to return to the emergency department with new or  worsening symptoms or new concerns. The patient verbalized understanding and agreement with plan.   This patient was discussed with Dr. Rulon Abide who agrees with assessment and plan.    Waynetta Pean, PA-C 03/19/15 1007  Ernestina Patches, MD 03/20/15 [phone]

## 2015-03-19 NOTE — ED Notes (Signed)
Pt. Had a output of 350cc in the catheter. Nurse was aware.

## 2015-03-21 LAB — URINE CULTURE: Colony Count: >=100000

## 2015-03-22 ENCOUNTER — Telehealth (HOSPITAL_BASED_OUTPATIENT_CLINIC_OR_DEPARTMENT_OTHER): Payer: Self-pay | Admitting: Emergency Medicine

## 2015-03-22 DIAGNOSIS — C61 Malignant neoplasm of prostate: Secondary | ICD-10-CM | POA: Diagnosis not present

## 2015-03-22 DIAGNOSIS — R339 Retention of urine, unspecified: Secondary | ICD-10-CM | POA: Diagnosis not present

## 2015-03-22 NOTE — Progress Notes (Signed)
ED Antimicrobial Stewardship Positive Culture Follow Up   Austin Nicholson is an 81 y.o. male who presented to [facility] on 03/19/2015 with a chief complaint of  Chief Complaint  Patient presents with  . foley catheter came out     Recent Results (from the past 720 hour(s))  Urine culture     Status: None   Collection Time: 03/19/15  4:40 PM  Result Value Ref Range Status   Specimen Description URINE, CATHETERIZED  Final   Special Requests NONE  Final   Colony Count   Final    >=100,000 COLONIES/ML Performed at [place]   Final    CITROBACTER FREUNDII Performed at [facility]    Report Status 03/21/2015 FINAL  Final   Organism ID, Bacteria CITROBACTER FREUNDII  Final      Susceptibility   Citrobacter freundii - MIC*    CEFAZOLIN >=64 RESISTANT Resistant     CEFTRIAXONE >=64 RESISTANT Resistant     CIPROFLOXACIN <=0.25 SENSITIVE Sensitive     GENTAMICIN <=1 SENSITIVE Sensitive     LEVOFLOXACIN 0.5 SENSITIVE Sensitive     NITROFURANTOIN 32 SENSITIVE Sensitive     TOBRAMYCIN <=1 SENSITIVE Sensitive     TRIMETH/SULFA <=20 SENSITIVE Sensitive     PIP/TAZO >=128 RESISTANT Resistant     * CITROBACTER FREUNDII    [x]  Treated with Cephalexin, organism resistant to prescribed antimicrobial  86 yoM with Citrobacter urine culture, resistant to Cephalexin and sensitive to Bactrim, Ciprofloxacin, Macrobid. Patient presented to have foley catheter replaced after the foley had come out and did not report urinary symptoms.   Patient to follow-up with Dr. Festus Aloe at [facility] urology on 4/13.  Will instruct patient to stop Cephalexin and will fax results to the urologist.  ED Provider: Lorre Munroe, PA-C   Saylee Sherrill, Maud Deed 03/22/2015, 12:10 PM Infectious Diseases Pharmacist Phone# [phone]

## 2015-03-22 NOTE — Telephone Encounter (Signed)
Post ED Visit - Positive Culture Follow-up: Successful Patient Follow-Up  Culture assessed and recommendations reviewed by: []  Wes Rosemount, Pharm.D., BCPS [x]  Heide Guile, Pharm.D., BCPS []  Alycia Rossetti, Pharm.D., BCPS []  [place], Pharm.D., BCPS, AAHIVP []  Legrand Como, Pharm.D., BCPS, AAHIVP []  Hassie Bruce, Pharm.D. []  Milus Glazier, Pharm.D.  Positive urine culture citrobacter  []  Patient discharged without antimicrobial prescription and treatment is now indicated [x]  Organism is resistant to prescribed ED discharge antimicrobial []  Patient with positive blood cultures  Changes discussed with ED provider: Lorre Munroe PA New antibiotic prescription stop keflex, no further antibiotics needed  Contacted patient, 03/22/15 1545 Culture report faxed to [facility] per request   Hazle Nordmann 03/22/2015, 3:44 PM

## 2015-05-02 DIAGNOSIS — R339 Retention of urine, unspecified: Secondary | ICD-10-CM | POA: Diagnosis not present

## 2015-05-25 ENCOUNTER — Other Ambulatory Visit: Payer: Self-pay | Admitting: Family Medicine

## 2015-05-25 DIAGNOSIS — I10 Essential (primary) hypertension: Secondary | ICD-10-CM

## 2015-05-25 MED ORDER — AMLODIPINE BESYLATE 5 MG PO TABS: 5.0000 mg | ORAL_TABLET | Freq: Every day | ORAL | Status: DC

## 2015-05-25 NOTE — Telephone Encounter (Addendum)
Pt scheduled f/u appt but says he is out of bp meds, pt requesting refill on amLODipine (NORVASC) 5 MG tablet. Please send refill to [facility] in [facility] off of [address]. Pt states refill is urgent because bp is elevated and does not have any medications.

## 2015-05-31 DIAGNOSIS — C61 Malignant neoplasm of prostate: Secondary | ICD-10-CM | POA: Diagnosis not present

## 2015-05-31 DIAGNOSIS — R339 Retention of urine, unspecified: Secondary | ICD-10-CM | POA: Diagnosis not present

## 2015-06-01 ENCOUNTER — Telehealth: Payer: Self-pay | Admitting: Family Medicine

## 2015-06-01 NOTE — Telephone Encounter (Signed)
Pam from [facility] called to request medical clearance for the patient, she will  fax over a medical clearance form that needs to be filled out and faxed back.

## 2015-06-06 ENCOUNTER — Ambulatory Visit: Payer: Medicare Other | Admitting: Family Medicine

## 2015-06-11 IMAGING — CT CT ABD-PELV W/ CM
2 of 5 series · 10 of 46 positions shown, 11 images · IV contrast (Iodine)
Comparison: None.

ADDENDUM:
In the interim since initial interpretation, the patient has been
diagnosed with prostate carcinoma. In light of this, the study was
reviewed and there is an 18mm right obturator lymph node on image
#66 series 201. There is a right perirectal node on image 67
measuring 15mm, and two left perirectal nodes on image 73 measuring
6 and 10mm, respectively. There is a 6mm right perirectal node on
image 71. In light of the diagnosis, these are concerning for
prostate carcinoma metastasis.
CLINICAL DATA: Urinary incontinence for several weeks may urinary
frequency, abdomen mass

EXAM:
CT ABDOMEN AND PELVIS WITH CONTRAST
TECHNIQUE: Multidetector CT imaging of the abdomen and pelvis was performed
using the standard protocol following bolus administration of
intravenous contrast.
CONTRAST:  100mL OMNIPAQUE IOHEXOL 300 MG/ML  SOLN

[Series 201: routine, idose (2) · axial · 0.78mm/px · z∈[-439,-84]mm · 7 of 93 slices shown, 8 images]
[im 11/93  soft-tissue]
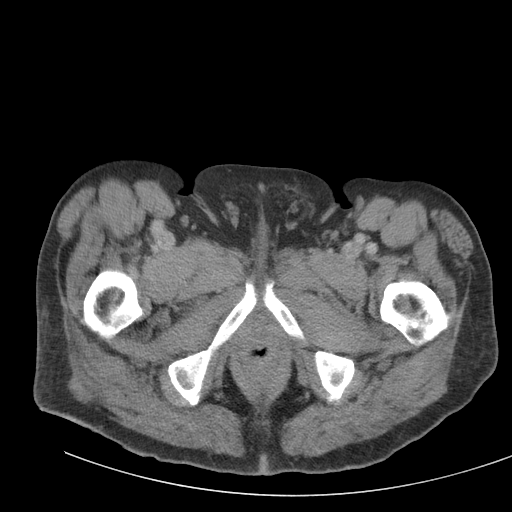
[im 11/93  bone]
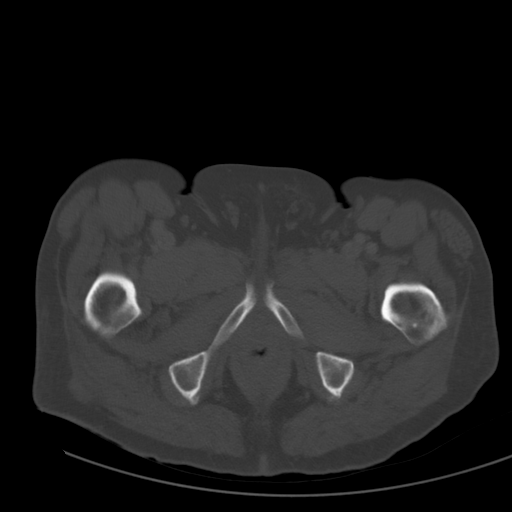
[im 21/93  soft-tissue]
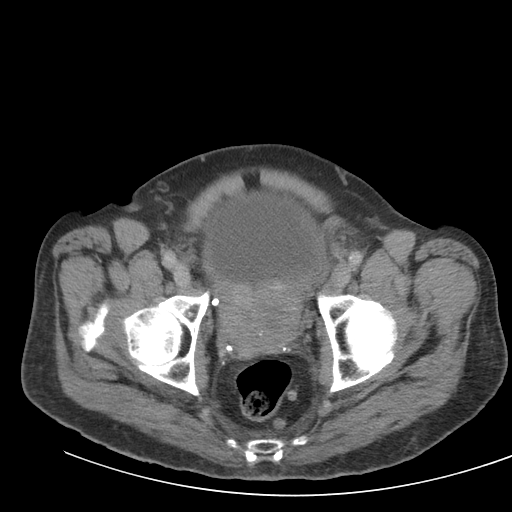
[im 36/93  soft-tissue]
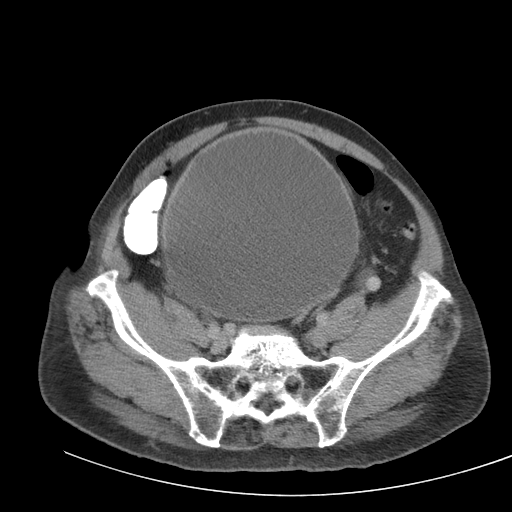
[im 47/93  soft-tissue]
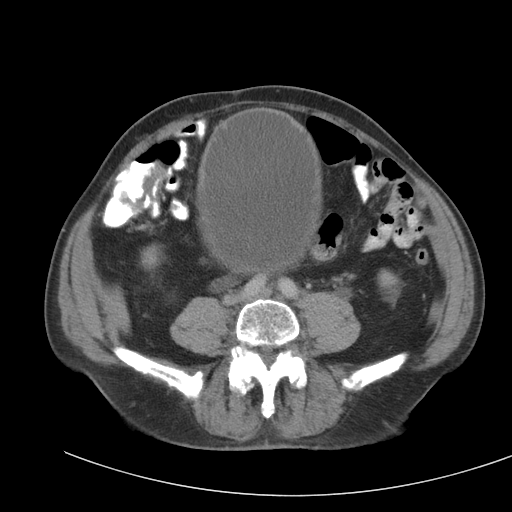
[im 57/93  soft-tissue]
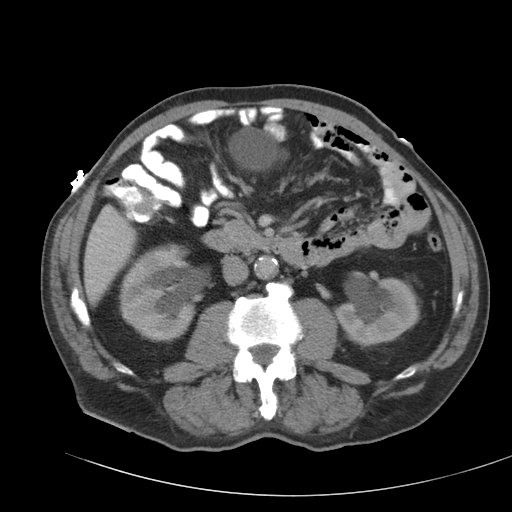
[im 72/93  soft-tissue]
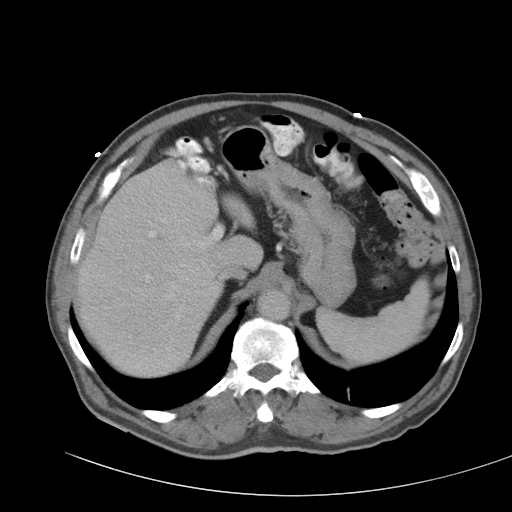
[im 82/93  soft-tissue]
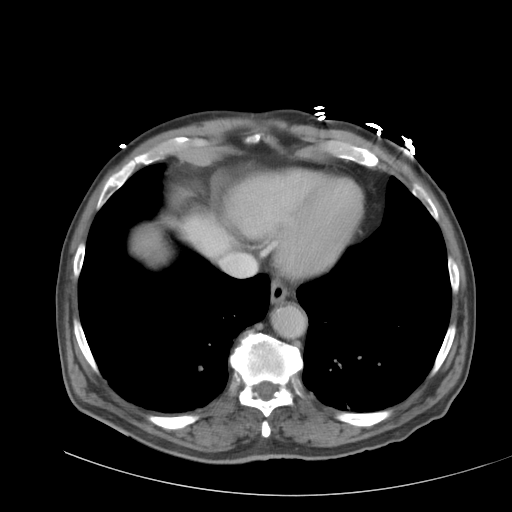

[Series 203: coronals, idose (2) · coronal · 0.45mm/px · 3 of 107 slices shown]
[im 36/107  soft-tissue]
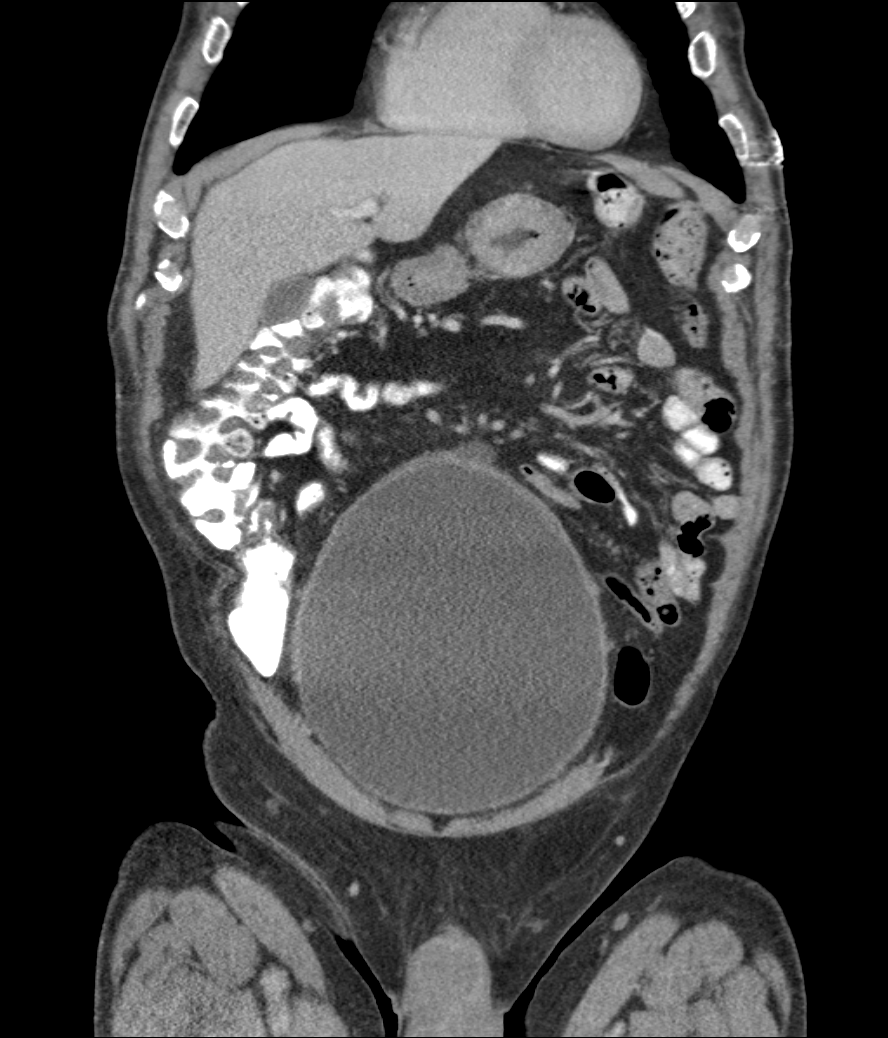
[im 48/107  soft-tissue]
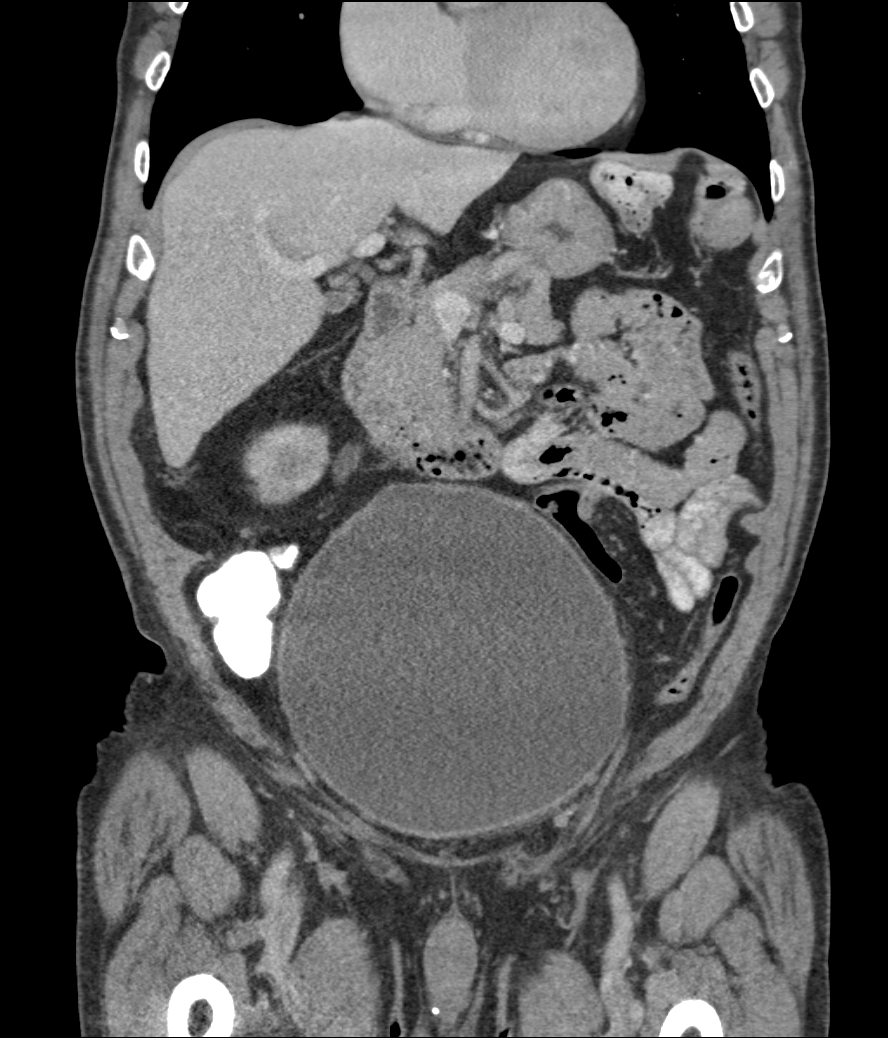
[im 59/107  soft-tissue]
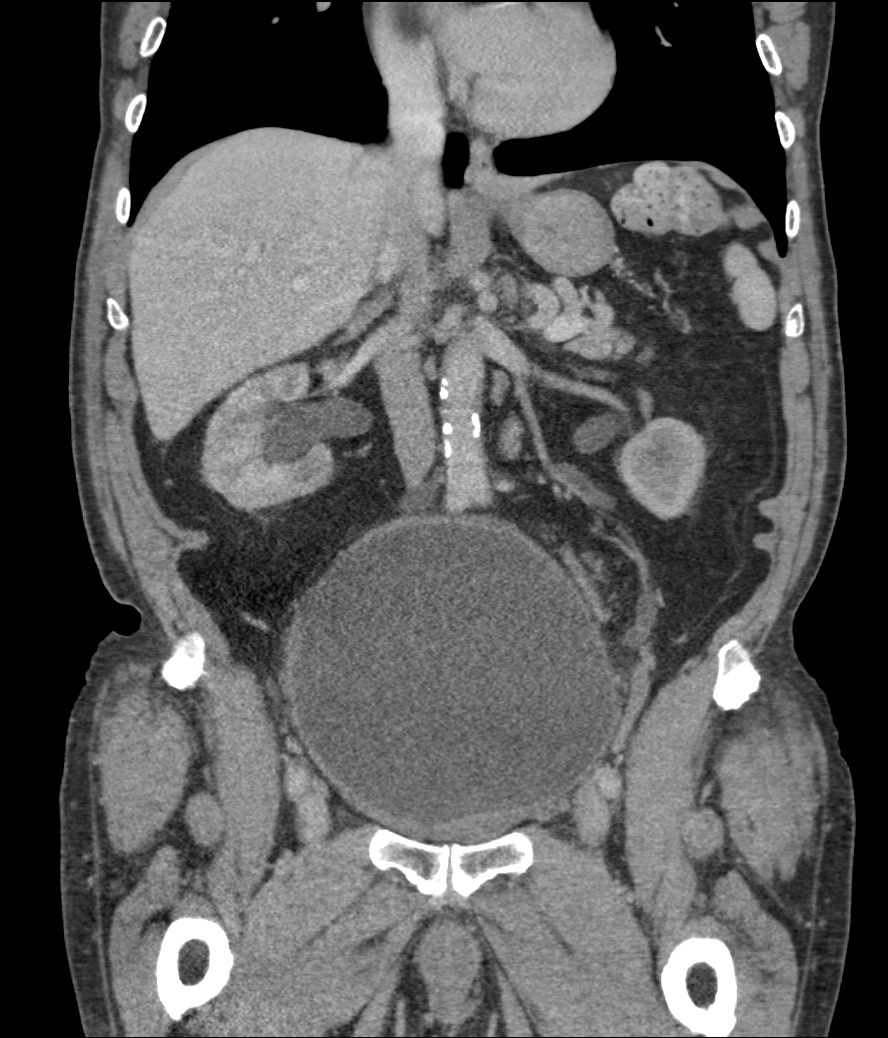

[10 of 46 positions shown; findings below may reference images not displayed]

FINDINGS: Discoid atelectasis left lung base. 2 mm nodular opacity right lung
base, pleural-based, most likely a tiny focus of peripheral
atelectasis.

4 mm low-attenuation lesion posterior right lobe of liver image 21,
too small characterize. Liver otherwise normal. Gallbladder and
spleen normal. Pancreas normal. Left adrenal gland normal. 8 mm
right adrenal nodule.

Calcified aorta without dilatation. Severe bilateral hydronephrosis.
4.3 cm cystic lesion lower pole right kidney with thin stripe of
hyperattenuation in the posterior [place]. There are few tiny
low-attenuation lesions in the midpole left kidney which are too
small characterize.

No ascites. No significant adenopathy in the abdomen or pelvis. No
acute musculoskeletal findings or suspicious focal osseous findings.

Markedly bladder distension. Bladder is distended to 16 x 15 x 15
cm. Prostate is enlarged to a diameter 6.3 x 5.9 cm.
IMPRESSION: 1. Pronounced bladder distension causing severe hydronephrosis.
There is enlargement of the prostate. Correlate with PSA levels,
with differential diagnostic possibilities including prostate
hypertrophy and carcinoma.
2. Multiple nonacute findings including tiny liver lesion that is
too small to characterize in which may be a cyst and a tiny adrenal
nodule on the right which does not require further workup based on
small 5. Right renal lesion may represent a cyst with some dense
material layering dependently or thin focus of calcification in the
Re and. No precontrast images available on this study and therefore
min enhancement not excluded. Consider renal ultrasound or renal
protocol CT to characterize further. Also tiny left renal lesions
too small to characterize but which may represent cysts.

## 2015-06-13 IMAGING — US US RENAL
1 series · 14 of 25 positions shown · non-contrast
Comparison: CT abdomen and pelvis 08/01/2014.

CLINICAL DATA: Urinary incontinence. Hydronephrosis on prior CT
scan.

EXAM:
RENAL/URINARY TRACT ULTRASOUND COMPLETE

[Series 1: us renal · 0.23mm/px · 14 of 30 slices shown]
[im 1/30]
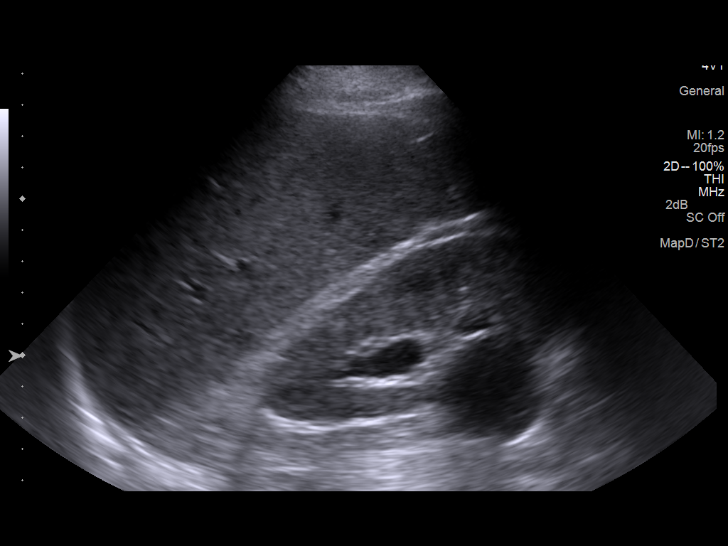
[im 3/30]
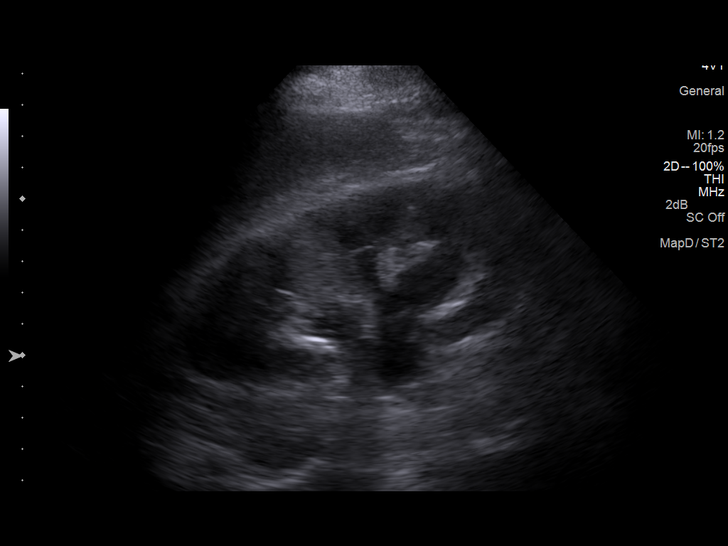
[im 5/30]
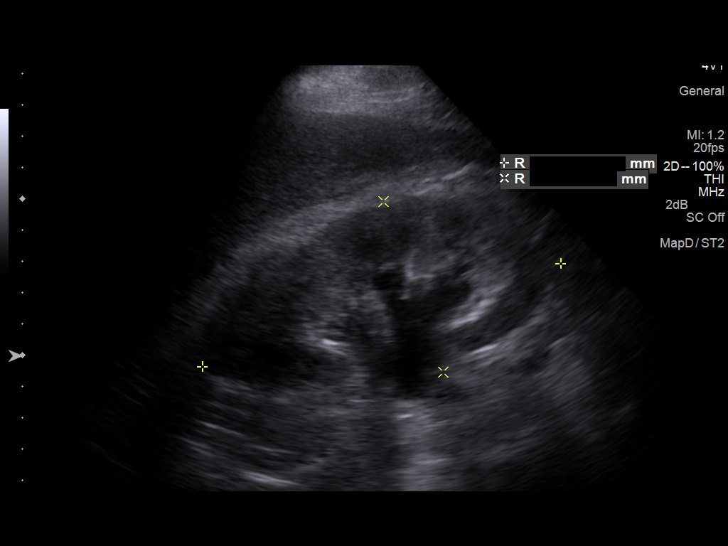
[im 8/30]
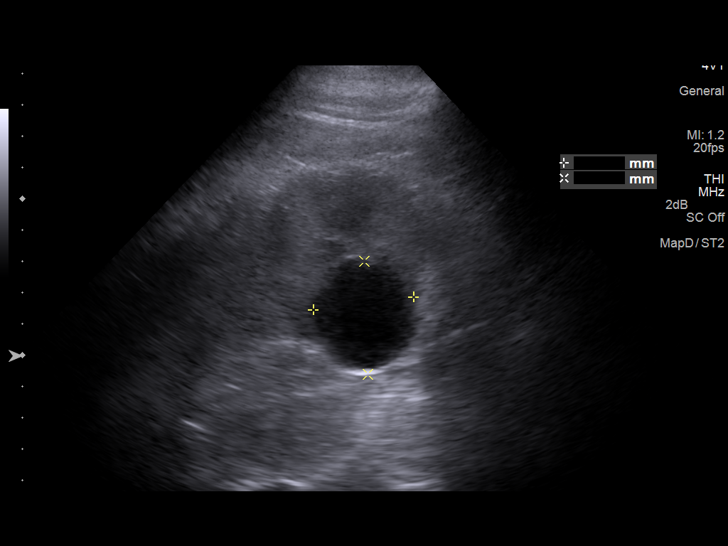
[im 10/30]
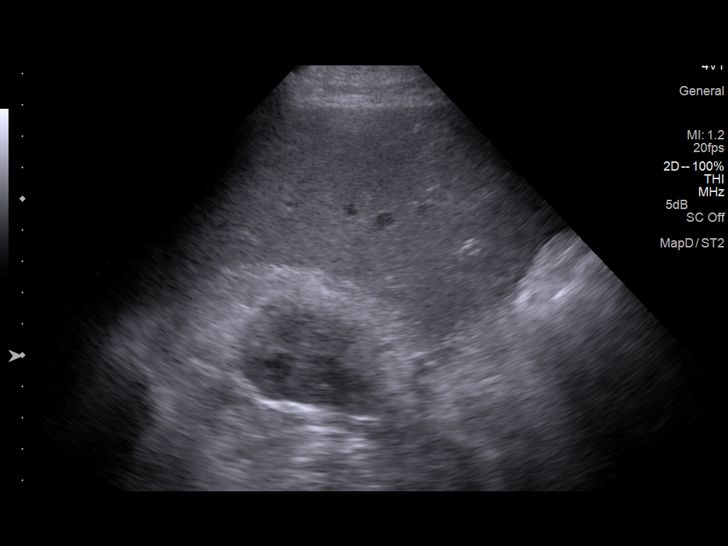
[im 11/30]
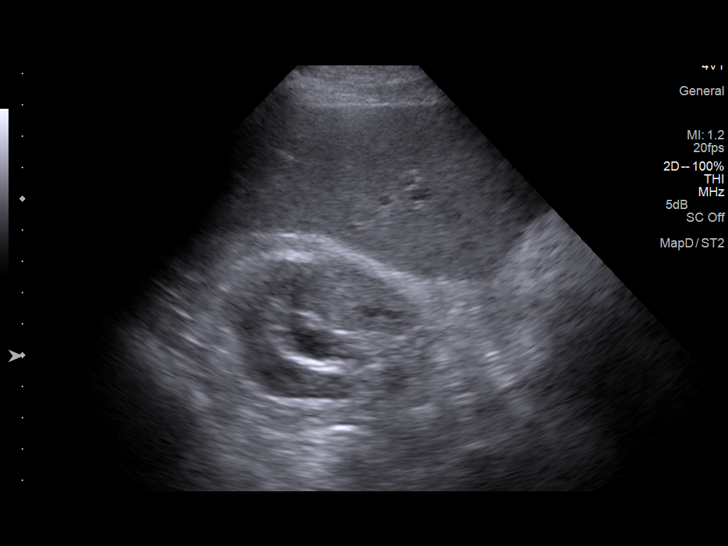
[im 14/30]
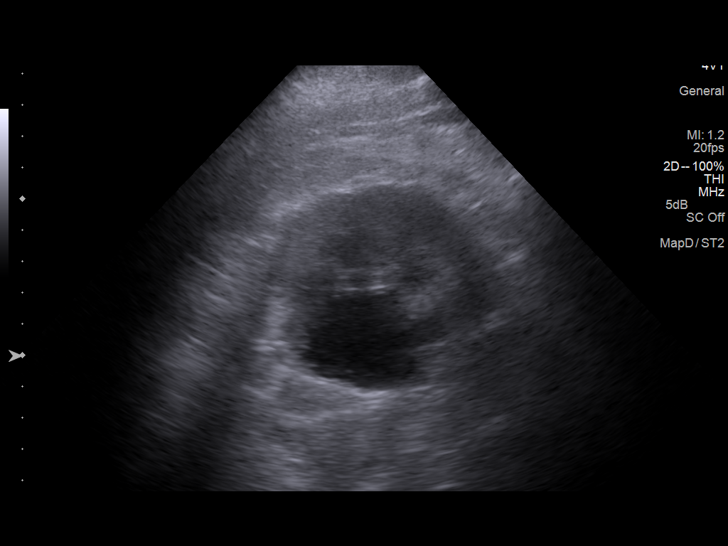
[im 16/30]
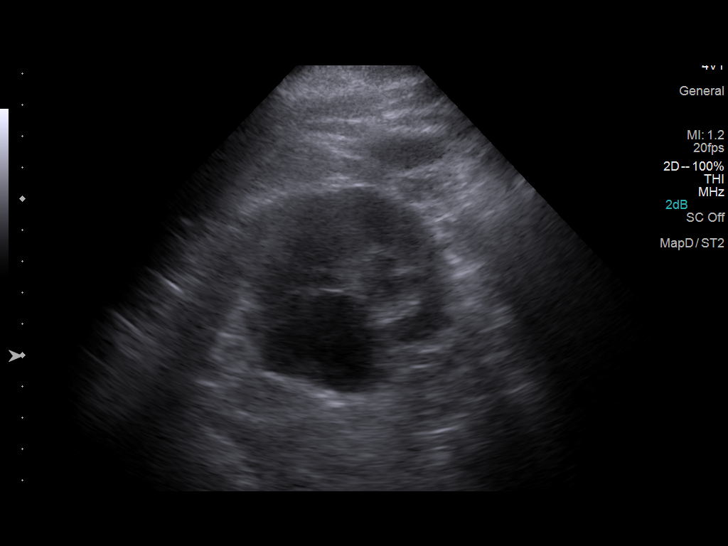
[im 19/30]
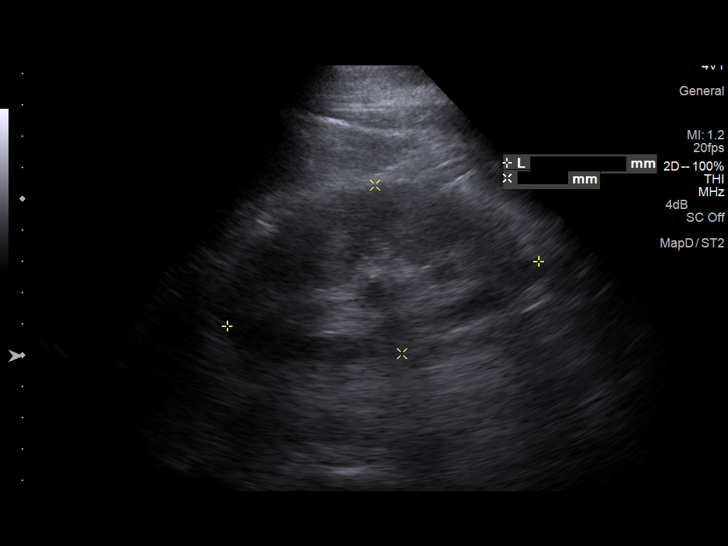
[im 20/30]
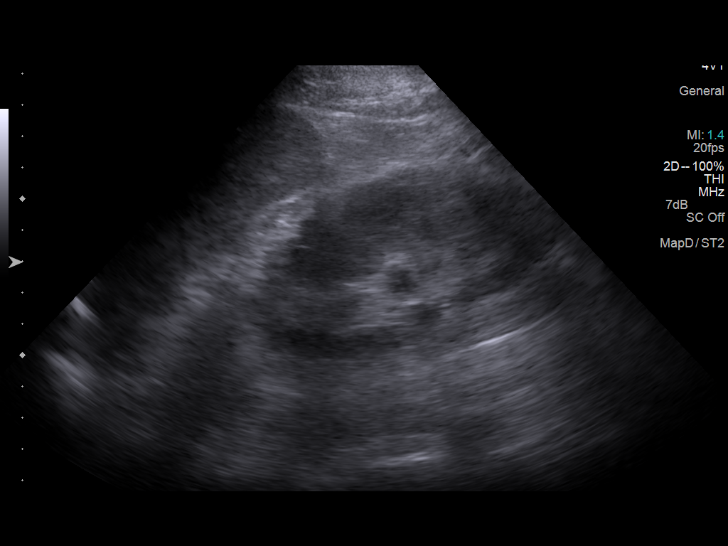
[im 22/30]
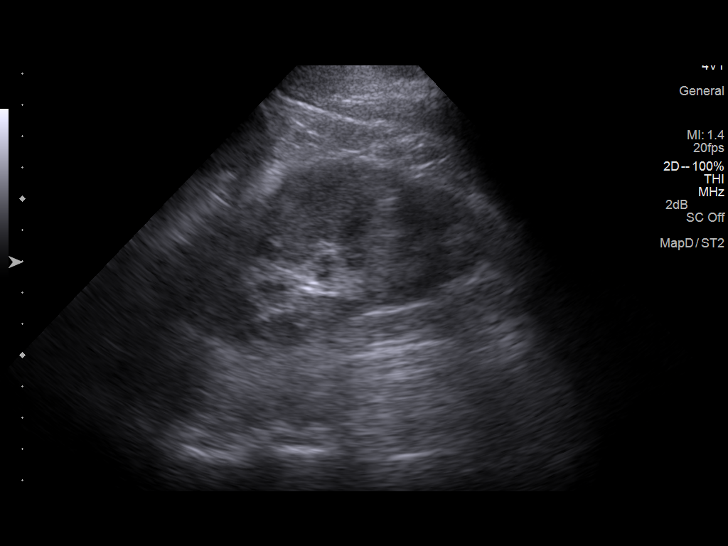
[im 25/30]
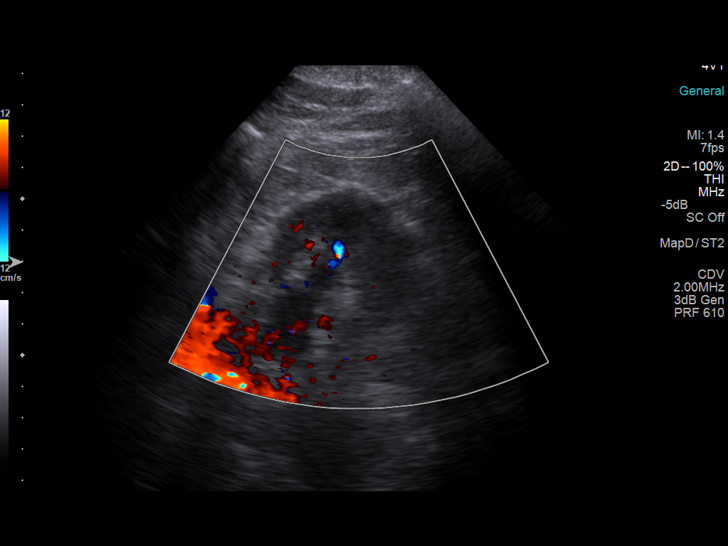
[im 27/30]
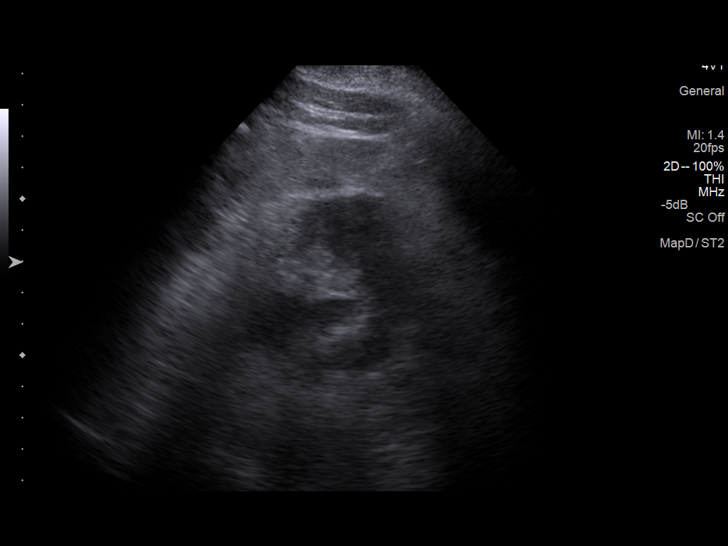
[im 30/30]
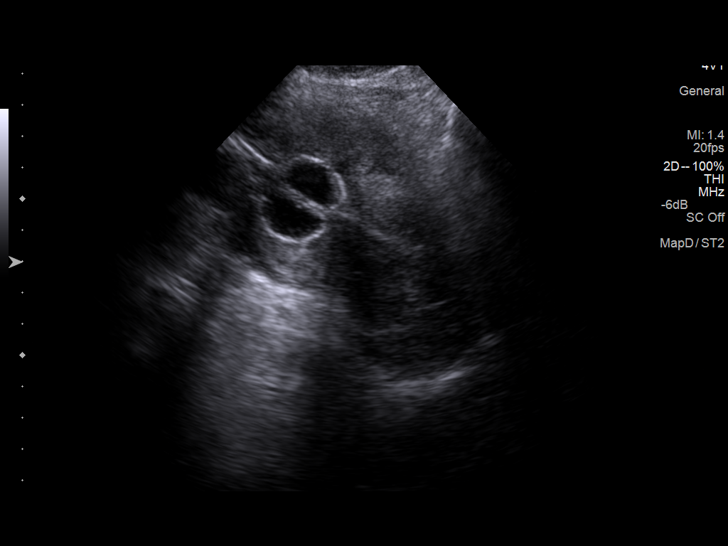

[14 of 25 positions shown; findings below may reference images not displayed]

FINDINGS: Right Kidney:

Length: 11.9 cm. Moderate right hydronephrosis does not appear
markedly changed compared to the prior examination. 3.8 cm lower
pole cyst is identified as on the prior study.

Left Kidney:

Length: 10.2 cm. Moderate left hydronephrosis does not appear
markedly changed. The left kidney is otherwise unremarkable.

Bladder:

Completely decompressed with a Foley catheter in place.
IMPRESSION: Moderate bilateral hydronephrosis does not appear markedly changed
compared to the prior study.

The urinary bladder is completely decompressed with a Foley catheter
in place.

## 2015-06-19 NOTE — Telephone Encounter (Signed)
Patient [facility] medical clearance appt.  Form filled out and faxed back based on available information.

## 2015-06-20 ENCOUNTER — Encounter: Payer: Self-pay | Admitting: Family Medicine

## 2015-06-20 ENCOUNTER — Other Ambulatory Visit: Payer: Self-pay

## 2015-06-20 ENCOUNTER — Ambulatory Visit: Payer: Medicare Other | Attending: Family Medicine | Admitting: Family Medicine

## 2015-06-20 VITALS — BP 146/66 | HR 65 | Temp 99.3°F | Resp 98 | Ht 66.0 in | Wt 172.0 lb

## 2015-06-20 DIAGNOSIS — I10 Essential (primary) hypertension: Secondary | ICD-10-CM | POA: Insufficient documentation

## 2015-06-20 DIAGNOSIS — Z01818 Encounter for other preprocedural examination: Secondary | ICD-10-CM | POA: Diagnosis not present

## 2015-06-20 LAB — CBC
HEMATOCRIT: 35.6 % — AB (ref 39.0–52.0)
Hemoglobin: 12 g/dL — ABNORMAL LOW (ref 13.0–17.0)
MCH: 28 pg (ref 26.0–34.0)
MCHC: 33.7 g/dL (ref 30.0–36.0)
MCV: 83 fL (ref 78.0–100.0)
MPV: 10.3 fL (ref 8.6–12.4)
Platelets: 283 10*3/uL (ref 150–400)
RBC: 4.29 MIL/uL (ref 4.22–5.81)
RDW: 14 % (ref 11.5–15.5)
WBC: 8.2 10*3/uL (ref 4.0–10.5)

## 2015-06-20 NOTE — Assessment & Plan Note (Signed)
A: pre-op clearance for TURPs P: Normal EKG, BP Labs pending-CBC, BMP Cleared for surgery pending normal labs

## 2015-06-20 NOTE — Progress Notes (Signed)
   Subjective:    Patient ID: Austin Nicholson, male    DOB: 04-07-1928, 81 y.o.   MRN: [id number] CC; medical clearance for TURPs procedure  HPI 81 yo M presents with wife wife for medical clearance for TURPs. He is not on blood thinners. Active medications are listed below.   Current Outpatient Prescriptions on File Prior to Visit  Medication Sig Dispense Refill  . amLODipine (NORVASC) 5 MG tablet Take 1 tablet (5 mg total) by mouth daily. 30 tablet 1  . calcium carbonate 1250 MG capsule Take 1,250 mg by mouth 2 (two) times daily with a meal.     No current facility-administered medications on file prior to visit.   Past Medical History  Diagnosis Date  . ARF (acute renal failure) 07/2014    with bilateral hydronephrosis   . Prostate cancer   . Cancer     basal cell carcinoma of forehead  . Hypertension    Review of Systems  Constitutional: Negative for fever, chills, fatigue and unexpected weight change.  Eyes: Negative for visual disturbance.  Respiratory: Negative for cough and shortness of breath.   Cardiovascular: Negative for chest pain, palpitations and leg swelling.  Gastrointestinal: Negative for nausea, vomiting, abdominal pain, diarrhea, constipation and blood in stool.  Musculoskeletal: Negative for myalgias, back pain, arthralgias, gait problem and neck pain.  Skin: Negative for rash.  Neurological: Negative for dizziness.  Hematological: Does not bruise/bleed easily.      Objective:   Physical Exam BP 146/66 mmHg  Pulse 65  Temp(Src) 99.3 F (37.4 C) (Oral)  Resp 98  Ht 5\' 6"  (1.676 m)  Wt 172 lb (78.019 kg)  BMI 27.77 kg/m2 General appearance: alert, cooperative and no distress Lungs: clear to auscultation bilaterally Heart: regular rate and rhythm, S1, S2 normal, no murmur, click, rub or gallop Skin: Skin color, texture, turgor normal. No rashes or lesions  Ext: no edema   EKG: normal EKG, normal sinus rhythm, 1st degree AV block.      Assessment  & Plan:

## 2015-06-20 NOTE — Progress Notes (Signed)
Medical clearance, date not schedule

## 2015-06-20 NOTE — Assessment & Plan Note (Signed)
A: BP at goal Med: compliant P: Continue current regimen  

## 2015-06-20 NOTE — Patient Instructions (Signed)
Austin Nicholson,  Thank you for coming in today. Continue current regimen.  You will be called with lab results.  Lab results and clearance letter will also be faxed to your urologist  F/u in 3 months, sooner if needed for hypertension  Dr. Adrian Blackwater

## 2015-06-21 ENCOUNTER — Encounter: Payer: Self-pay | Admitting: Family Medicine

## 2015-06-21 ENCOUNTER — Telehealth: Payer: Self-pay | Admitting: Family Medicine

## 2015-06-21 ENCOUNTER — Telehealth: Payer: Self-pay | Admitting: *Deleted

## 2015-06-21 LAB — BASIC METABOLIC PANEL
BUN: 29 mg/dL — AB (ref 6–23)
CALCIUM: 9.1 mg/dL (ref 8.4–10.5)
CO2: 26 mEq/L (ref 19–32)
CREATININE: 1.78 mg/dL — AB (ref 0.50–1.35)
Chloride: 105 mEq/L (ref 96–112)
Glucose, Bld: 89 mg/dL (ref 70–99)
POTASSIUM: 4.5 meq/L (ref 3.5–5.3)
Sodium: 142 mEq/L (ref 135–145)

## 2015-06-21 NOTE — Telephone Encounter (Signed)
Unable to contact Pt Voice mail not set up

## 2015-06-21 NOTE — Telephone Encounter (Signed)
Error, letter only encounter

## 2015-06-21 NOTE — Telephone Encounter (Signed)
-----   Message from Boykin Nearing, MD sent at 06/21/2015  9:21 AM EDT ----- BUN/Cr improved Hgb 12 Results and letter of clearance faxed to Dr. Junious Silk

## 2015-06-28 ENCOUNTER — Other Ambulatory Visit: Payer: Self-pay | Admitting: Urology

## 2015-06-30 NOTE — Patient Instructions (Addendum)
KIELAN DREISBACH  06/30/2015   Your procedure is scheduled on: July 04, 2015  Report to [facility]  Entrance take [place]  elevators to 3rd floor to  [place] at 5:30  AM.  Call this number if you have problems the morning of surgery [phone]   Remember: ONLY 1 PERSON MAY GO WITH YOU TO SHORT STAY TO GET  READY MORNING OF [place].  Do not eat food or drink liquids :After Midnight.     Take these medicines the morning of surgery with A SIP OF WATER: Amlodipine (norvasc), Nitrofurantoin (macrodantin)                               You may not have any metal on your body including hair pins and              piercings  Do not wear jewelry, , lotions, powders or perfumes, deodorant                      Men may shave face and neck.   Do not bring valuables to the hospital. [place].  Contacts, dentures or bridgework may not be worn into surgery.  Leave suitcase in the car. After surgery it may be brought to your room.       Special Instructions: coughing and deep breathing exercises, leg exercises              Please read over the following fact sheets you were given: _____________________________________________________________________             [facility] - Preparing for Surgery Before surgery, you can play an important role.  Because skin is not sterile, your skin needs to be as free of germs as possible.  You can reduce the number of germs on your skin by washing with CHG (chlorahexidine gluconate) soap before surgery.  CHG is an antiseptic cleaner which kills germs and bonds with the skin to continue killing germs even after washing. Please DO NOT use if you have an allergy to CHG or antibacterial soaps.  If your skin becomes reddened/irritated stop using the CHG and inform your nurse when you arrive at Short Stay. Do not shave (including legs and underarms) for at least 48 hours prior to  the first CHG shower.  You may shave your face/neck. Please follow these instructions carefully:  1.  Shower with CHG Soap the night before surgery and the  morning of Surgery.  2.  If you choose to wash your hair, wash your hair first as usual with your  normal  shampoo.  3.  After you shampoo, rinse your hair and body thoroughly to remove the  shampoo.                           4.  Use CHG as you would any other liquid soap.  You can apply chg directly  to the skin and wash                       Gently with a scrungie or clean washcloth.  5.  Apply the CHG Soap to your body ONLY FROM  THE NECK DOWN.   Do not use on face/ open                           Wound or open sores. Avoid contact with eyes, ears mouth and genitals (private parts).                       Wash face,  Genitals (private parts) with your normal soap.             6.  Wash thoroughly, paying special attention to the area where your surgery  will be performed.  7.  Thoroughly rinse your body with warm water from the neck down.  8.  DO NOT shower/wash with your normal soap after using and rinsing off  the CHG Soap.                9.  Pat yourself dry with a clean towel.            10.  Wear clean pajamas.            11.  Place clean sheets on your bed the night of your first shower and do not  sleep with pets. Day of Surgery : Do not apply any lotions/deodorants the morning of surgery.  Please wear clean clothes to the hospital/surgery center.  FAILURE TO FOLLOW THESE INSTRUCTIONS MAY RESULT IN THE CANCELLATION OF YOUR SURGERY PATIENT SIGNATURE_________________________________  NURSE SIGNATURE__________________________________  ________________________________________________________________________

## 2015-07-03 ENCOUNTER — Encounter (HOSPITAL_COMMUNITY): Payer: Self-pay

## 2015-07-03 ENCOUNTER — Encounter (HOSPITAL_COMMUNITY)
Admission: RE | Admit: 2015-07-03 | Discharge: 2015-07-03 | Disposition: A | Discharge status: Home / Self Care | Payer: Medicare Other | Source: Ambulatory Visit | Attending: Urology | Admitting: Urology

## 2015-07-03 DIAGNOSIS — N138 Other obstructive and reflux uropathy: Secondary | ICD-10-CM | POA: Diagnosis not present

## 2015-07-03 DIAGNOSIS — R338 Other retention of urine: Secondary | ICD-10-CM | POA: Diagnosis not present

## 2015-07-03 DIAGNOSIS — I10 Essential (primary) hypertension: Secondary | ICD-10-CM | POA: Diagnosis not present

## 2015-07-03 DIAGNOSIS — Z79899 Other long term (current) drug therapy: Secondary | ICD-10-CM | POA: Diagnosis not present

## 2015-07-03 DIAGNOSIS — Z85828 Personal history of other malignant neoplasm of skin: Secondary | ICD-10-CM | POA: Diagnosis not present

## 2015-07-03 DIAGNOSIS — N401 Enlarged prostate with lower urinary tract symptoms: Secondary | ICD-10-CM | POA: Diagnosis not present

## 2015-07-03 DIAGNOSIS — C61 Malignant neoplasm of prostate: Secondary | ICD-10-CM | POA: Diagnosis not present

## 2015-07-03 HISTORY — DX: Unspecified osteoarthritis, unspecified site: M19.90

## 2015-07-03 HISTORY — DX: Pneumonia, unspecified organism: J18.9

## 2015-07-03 LAB — PSA: PSA: 3.43 ng/mL (ref 0.00–4.00)

## 2015-07-03 MED ORDER — DEXTROSE 5 % IV SOLN
1.5000 mg/kg | INTRAVENOUS | Status: AC
  Administered 2015-07-04: 120 mg via INTRAVENOUS
  Filled 2015-07-03: qty 3

## 2015-07-03 NOTE — Progress Notes (Addendum)
06-30-15 received copy of CBC and BMP lab results dated 06-20-15 from Dr. Adrian Blackwater office ([place]).  Results were sent to Dr. Junious Silk from Dr. Adrian Blackwater.  (did not route lab results to Dr. Junious Silk again)

## 2015-07-03 NOTE — H&P (Signed)
History of Present Illness            F/u - PCP is Dr. Adrian Blackwater. [facility]     1) Prostate cancer - September 2015 - high risk disease, T2-T3, prostate 139 grams, PSA 300, Gleason 4+5 = 9 in all cores.   Last staging: Oct 2015 CT scan negative. Bone scan negative.   Current treatment: Androgen deprivation started Oct 2015  PSA hx:  Sept 2015 PSA 300  Oct 2015 [facility]  Nov 2015 PSA 47   Dec 2015 Lupron x 3 mo  Jan 2016 PSA 15  -Feb 2016 - : "It is the consensus of the multidisciplinary group that he continue with androgen deprivation therapy with plans to proceed with pelvic radiation. However, he still has urinary retention which needs to be addressed. He has been on alpha-blocker therapy and androgen deprivation therapy and is apparently scheduled for a voiding trial in January with Dr. Junious Silk. If he is unable to pass a voiding trial, he may be best served with a TURP prior to radiation therapy".   -Mar 01, 2015 - Lupron x 3 months   -Apr 2016 PSA 6.97    2) BPH (125 g), retention s/p foley - plan initiation of androgen deprivation and tamsulosin. Consider TURP.   -Sept 2015 trus prostate = 139 grams  -February 2016-failed voiding trial, catheter changed  -Apr 2016 - catheter fell out. Pt could not void. Went to ED. 1L drained after reinsertion.    3) ARF, bilateral hydronephrosis - Aug 2015 - initial BUN, creatinine 50 and 5 improved to 34 and 3.8 . Diuresed about 16 L in 4 days.   -Oct 2015 bone scan-no hydronephrosis    4) gross hematuria - Mar 2016 - cystoscopy benign. Good greenlight candidate. He failed a voiding trial.      June 2016 interval hx  Patient returns in continued management of prostate cancer and urinary retention. He underwent UDS May 2016. UDS revealed capacity of 457 mL. Hyposensitive bladder. Minimal instability at capacity. Voluntary contraction generated with a max detrusor pressure of 78 cm  of water. Contraction was not well sustained an flow rate was minimal. He only voided 4 mL. EMG was normal.    PSA down to 7 in april.     He has been well and has complaints.     Past Medical History Problems  1. History of acute renal failure (Z87.448) 2. History of basal cell carcinoma (Z85.828)  Current Meds 1. Calcium TABS;  Therapy: (Recorded:11Nov2015) to Recorded 2. Cephalexin 500 MG Oral Capsule; TAKE 1 CAPSULE 3 TIMES DAILY;  Therapy: 23JSE8315 to (Evaluate:04Mar2016)  Requested for: 28Feb2016; Last  Rx:28Feb2016 Ordered 3. Ciprofloxacin HCl - 500 MG Oral Tablet; TAKE 1 TABLET TWICE DAILY;  Therapy: 13Apr2016 to (Evaluate:23Apr2016)  Requested for: 13Apr2016; Last  Rx:13Apr2016 Ordered  Allergies Medication  1. No Known Drug Allergies  Family History Problems  1. Family history of dementia (Z81.8) : Mother 2. Family history of pneumonia (Z83.1) : Father 3. Family history of prostate cancer (Z80.42) : Father  Social History Problems  1. Denied: History of Alcohol use 2. Never a smoker 3. Occupation   teaches piano and organ 4. Widowed  Vitals Vital Signs [Data Includes: Last 1 Day]  Recorded: 22Jun2016 04:03PM  Blood Pressure: 172 / 78 Temperature: 98 F Heart Rate: 64  Physical Exam Constitutional: Well nourished and well developed . No acute distress.  Pulmonary: No respiratory distress and normal respiratory  rhythm and effort.  Cardiovascular: Heart rate and rhythm are normal . No peripheral edema.  Neuro/Psych:. Mood and affect are appropriate.    Procedure Lupron 22.5 mg IM given today.  Foley catheter was removed and he was prepped and draped in the usual sterile fashion. A 16 French Foley catheter was placed without difficulty. Urine sent for culture.     Assessment Assessed  1. Prostate cancer (C61) 2. Urinary retention (R33.9)  Plan Prostate cancer  1. Administered: Lupron Depot 22.5 MG Intramuscular Kit 2. Follow-up Month x 3  Office  Follow-up  Status: Hold For - Appointment,Date of Service   Requested for: 22Jun2016 3. Follow-up Schedule Surgery Office  Follow-up  Status: Hold For - Appointment   Requested for: 22Jun2016 Urinary retention  4. Cath, simple, wIinsert Temp Cath; Status:Hold For - Appointment,Date of Service;  Requested for:22Jun2016;  5. URINE CULTURE; Status:Hold For - Specimen/Data Collection,Appointment; Requested  for:22Jun2016;  6. Follow-up After Test Office  Follow-up  Status: Canceled  Discussion/Summary                 BPH, retention - urodynamics showed a good bladder contraction. We discussed in most cases with a greenlight photo vaporization or TURP success is good in most patients can void on her own. Flow symptoms improve and they do not need Foley catheter. We discussed risks such as bleeding infection cardiovascular risk, stricture, incontinence. We discussed he has a large prostate may need a staged procedure. We discussed with start with a greenlight but he may need conversion to TURP. And studies greenlight and TURP are equivalent for success with higher bleeding risk for TURP but higher stricture rate for greenlight also higher conversion from greenlight TURP with large prostate. All questions answered and he elects to proceed.    PCa - continue androgen deprivation. I'll send a PSA with preop labs.      Signatures Electronically signed by : Festus Aloe, M.D.; May 31 2015  4:52PM EST

## 2015-07-03 NOTE — Progress Notes (Signed)
   07/03/15 1414  OBSTRUCTIVE SLEEP APNEA  Have you ever been diagnosed with sleep apnea through a sleep study? No  Do you snore loudly (loud enough to be heard through closed doors)?  0  Do you often feel tired, fatigued, or sleepy during the daytime? 1  Has anyone observed you stop breathing during your sleep? 0  Do you have, or are you being treated for high blood pressure? 1  BMI more than 35 kg/m2? 0  Age over 79 years old? 1  Neck circumference greater than 40 cm/16 inches? 0  Gender: 1  Obstructive Sleep Apnea Score 4

## 2015-07-03 NOTE — Progress Notes (Addendum)
06-30-15 - - Tried twice to fax request for EKG on 06-20-2015 with paper tracing from [facility] and [facility]..  Both times received Fax error report.   07-03-15 - Faxed request for EKG on 06-20-15 with  Paper tracing to [facility]  EKG was not scanned in to EPIC.  Also called [facility] and left message on triage nurse voice mail to fax copy of EKG with paper tracing.  Patient's preop appointment at 2:00 today.  07-03-15 - Rec'd EKG paper tracing from [place] at 2:48.

## 2015-07-03 NOTE — Progress Notes (Signed)
06-21-15 - Surgical clearance (Dr. Adrian Blackwater) -in chart 06-20-15 - BMP & CBC  - in chart 06-20-15 - EKG - in chart (did not receive paper tracing until 07-03-15 at 1438 ) 06-20-15 - LOV - Dr. Adrian Blackwater - [facility] 11-29-14 - LOV - Dr. Alen Blew (onc) - [facility] 08-02-14- 2D Echo & EKG - [facility] 08-01-14 - CT Abd/Pelvis w contrast - [facility]

## 2015-07-04 ENCOUNTER — Encounter (HOSPITAL_COMMUNITY)
Admission: RE | Disposition: A | Discharge status: Home / Self Care | Payer: Self-pay | Source: Ambulatory Visit | Attending: Urology

## 2015-07-04 ENCOUNTER — Ambulatory Visit (HOSPITAL_COMMUNITY): Payer: Medicare Other | Admitting: Anesthesiology

## 2015-07-04 ENCOUNTER — Observation Stay (HOSPITAL_COMMUNITY)
Admission: RE | Admit: 2015-07-04 | Discharge: 2015-07-05 | Disposition: A | Discharge status: Home / Self Care | Payer: Medicare Other | Source: Ambulatory Visit | Attending: Urology | Admitting: Urology

## 2015-07-04 ENCOUNTER — Encounter (HOSPITAL_COMMUNITY): Payer: Self-pay | Admitting: Anesthesiology

## 2015-07-04 DIAGNOSIS — N138 Other obstructive and reflux uropathy: Secondary | ICD-10-CM | POA: Diagnosis present

## 2015-07-04 DIAGNOSIS — C61 Malignant neoplasm of prostate: Secondary | ICD-10-CM

## 2015-07-04 DIAGNOSIS — I10 Essential (primary) hypertension: Secondary | ICD-10-CM | POA: Diagnosis not present

## 2015-07-04 DIAGNOSIS — R338 Other retention of urine: Secondary | ICD-10-CM | POA: Diagnosis not present

## 2015-07-04 DIAGNOSIS — N401 Enlarged prostate with lower urinary tract symptoms: Principal | ICD-10-CM | POA: Insufficient documentation

## 2015-07-04 DIAGNOSIS — Z79899 Other long term (current) drug therapy: Secondary | ICD-10-CM | POA: Insufficient documentation

## 2015-07-04 DIAGNOSIS — Z85828 Personal history of other malignant neoplasm of skin: Secondary | ICD-10-CM | POA: Insufficient documentation

## 2015-07-04 HISTORY — PX: GREEN LIGHT LASER TURP (TRANSURETHRAL RESECTION OF PROSTATE: SHX6260

## 2015-07-04 SURGERY — GREEN LIGHT LASER TURP (TRANSURETHRAL RESECTION OF PROSTATE
Anesthesia: General | Site: Prostate

## 2015-07-04 MED ORDER — DOCUSATE SODIUM 100 MG PO CAPS
100.0000 mg | ORAL_CAPSULE | Freq: Two times a day (BID) | ORAL | Status: DC
  Administered 2015-07-04 – 2015-07-05 (×3): 100 mg via ORAL
  Filled 2015-07-04 (×4): qty 1

## 2015-07-04 MED ORDER — SODIUM CHLORIDE 0.9 % IR SOLN
Status: DC | PRN
  Administered 2015-07-04: 15000 mL

## 2015-07-04 MED ORDER — HYOSCYAMINE SULFATE 0.125 MG SL SUBL
0.1250 mg | SUBLINGUAL_TABLET | SUBLINGUAL | Status: DC | PRN
  Filled 2015-07-04: qty 1

## 2015-07-04 MED ORDER — GLYCOPYRROLATE 0.2 MG/ML IJ SOLN
INTRAMUSCULAR | Status: DC | PRN
  Administered 2015-07-04: 0.2 mg via INTRAVENOUS

## 2015-07-04 MED ORDER — PROPOFOL 10 MG/ML IV BOLUS
INTRAVENOUS | Status: DC | PRN
  Administered 2015-07-04: 30 mg via INTRAVENOUS
  Administered 2015-07-04: 90 mg via INTRAVENOUS

## 2015-07-04 MED ORDER — AMLODIPINE BESYLATE 5 MG PO TABS
5.0000 mg | ORAL_TABLET | Freq: Every day | ORAL | Status: DC
  Administered 2015-07-04 – 2015-07-05 (×2): 5 mg via ORAL
  Filled 2015-07-04 (×2): qty 1

## 2015-07-04 MED ORDER — BELLADONNA ALKALOIDS-OPIUM 16.2-60 MG RE SUPP
RECTAL | Status: AC
  Filled 2015-07-04: qty 1

## 2015-07-04 MED ORDER — PROPOFOL 10 MG/ML IV BOLUS
INTRAVENOUS | Status: AC
  Filled 2015-07-04: qty 20

## 2015-07-04 MED ORDER — DIPHENHYDRAMINE HCL 12.5 MG/5ML PO ELIX: 12.5000 mg | ORAL_SOLUTION | Freq: Four times a day (QID) | ORAL | Status: DC | PRN

## 2015-07-04 MED ORDER — GLYCOPYRROLATE 0.2 MG/ML IJ SOLN
INTRAMUSCULAR | Status: AC
  Filled 2015-07-04: qty 1

## 2015-07-04 MED ORDER — FENTANYL CITRATE (PF) 100 MCG/2ML IJ SOLN: 25.0000 ug | INTRAMUSCULAR | Status: DC | PRN

## 2015-07-04 MED ORDER — ACETAMINOPHEN 325 MG PO TABS: 650.0000 mg | ORAL_TABLET | ORAL | Status: DC | PRN

## 2015-07-04 MED ORDER — 0.9 % SODIUM CHLORIDE (POUR BTL) OPTIME
TOPICAL | Status: DC | PRN
  Administered 2015-07-04: 1000 mL

## 2015-07-04 MED ORDER — ISOPROPYL ALCOHOL 70 % SOLN
Status: AC
  Filled 2015-07-04: qty 480

## 2015-07-04 MED ORDER — NITROFURANTOIN MONOHYD MACRO 100 MG PO CAPS
100.0000 mg | ORAL_CAPSULE | Freq: Two times a day (BID) | ORAL | Status: DC
  Administered 2015-07-04 – 2015-07-05 (×3): 100 mg via ORAL
  Filled 2015-07-04 (×3): qty 1

## 2015-07-04 MED ORDER — ONDANSETRON HCL 4 MG/2ML IJ SOLN: 4.0000 mg | INTRAMUSCULAR | Status: DC | PRN

## 2015-07-04 MED ORDER — DIPHENHYDRAMINE HCL 50 MG/ML IJ SOLN: 12.5000 mg | Freq: Four times a day (QID) | INTRAMUSCULAR | Status: DC | PRN

## 2015-07-04 MED ORDER — LIDOCAINE HCL (CARDIAC) 20 MG/ML IV SOLN
INTRAVENOUS | Status: AC
  Filled 2015-07-04: qty 5

## 2015-07-04 MED ORDER — FENTANYL CITRATE (PF) 100 MCG/2ML IJ SOLN
INTRAMUSCULAR | Status: AC
  Filled 2015-07-04: qty 2

## 2015-07-04 MED ORDER — LACTATED RINGERS IV SOLN: INTRAVENOUS | Status: DC

## 2015-07-04 MED ORDER — NITROFURANTOIN MACROCRYSTAL 100 MG PO CAPS: 100.0000 mg | ORAL_CAPSULE | Freq: Two times a day (BID) | ORAL | Status: DC

## 2015-07-04 MED ORDER — MEPERIDINE HCL 50 MG/ML IJ SOLN: 6.2500 mg | INTRAMUSCULAR | Status: DC | PRN

## 2015-07-04 MED ORDER — LACTATED RINGERS IV SOLN
INTRAVENOUS | Status: DC | PRN
Start: 2015-07-04 — End: 2015-07-04
  Administered 2015-07-04: 06:00:00 via INTRAVENOUS

## 2015-07-04 MED ORDER — ONDANSETRON HCL 4 MG/2ML IJ SOLN
INTRAMUSCULAR | Status: DC | PRN
  Administered 2015-07-04: 4 mg via INTRAVENOUS

## 2015-07-04 MED ORDER — OXYCODONE HCL 5 MG PO TABS: 5.0000 mg | ORAL_TABLET | ORAL | Status: DC | PRN

## 2015-07-04 MED ORDER — FENTANYL CITRATE (PF) 100 MCG/2ML IJ SOLN
INTRAMUSCULAR | Status: DC | PRN
  Administered 2015-07-04 (×4): 25 ug via INTRAVENOUS

## 2015-07-04 MED ORDER — ZOLPIDEM TARTRATE 5 MG PO TABS: 5.0000 mg | ORAL_TABLET | Freq: Every evening | ORAL | Status: DC | PRN

## 2015-07-04 MED ORDER — PROMETHAZINE HCL 25 MG/ML IJ SOLN: 6.2500 mg | INTRAMUSCULAR | Status: DC | PRN

## 2015-07-04 MED ORDER — VANCOMYCIN HCL IN DEXTROSE 1-5 GM/200ML-% IV SOLN
1000.0000 mg | INTRAVENOUS | Status: AC
  Administered 2015-07-04: 1000 mg via INTRAVENOUS
  Filled 2015-07-04: qty 200

## 2015-07-04 SURGICAL SUPPLY — 15 items
BAG URINE DRAINAGE (UROLOGICAL SUPPLIES) ×3 IMPLANT
BAG URO CATCHER STRL LF (DRAPE) ×3 IMPLANT
CATH TIEMANN FOLEY 18FR 5CC (CATHETERS) ×3 IMPLANT
DRAPE CAMERA CLOSED 9X96 (DRAPES) ×3 IMPLANT
GLOVE BIOGEL M STRL SZ7.5 (GLOVE) ×3 IMPLANT
GOWN STRL REUS W/TWL XL LVL3 (GOWN DISPOSABLE) ×3 IMPLANT
HOLDER FOLEY CATH W/STRAP (MISCELLANEOUS) ×3 IMPLANT
LASER FIBER /GREENLIGHT LASER (Laser) ×3 IMPLANT
LASER GREENLIGHT RENTAL P/PROC (Laser) ×3 IMPLANT
MANIFOLD NEPTUNE II (INSTRUMENTS) ×3 IMPLANT
PACK CYSTO (CUSTOM PROCEDURE TRAY) ×3 IMPLANT
SYR 30ML LL (SYRINGE) ×3 IMPLANT
SYRINGE IRR TOOMEY STRL 70CC (SYRINGE) IMPLANT
TUBING CONNECTING 10 (TUBING) ×2 IMPLANT
TUBING CONNECTING 10' (TUBING) ×1

## 2015-07-04 NOTE — Transfer of Care (Signed)
Immediate Anesthesia Transfer of Care Note  Patient: Austin Nicholson  Procedure(s) Performed: Procedure(s): GREEN LIGHT PHOTO VAPORILIZATION LASER,   (N/A)  Patient Location: PACU  Anesthesia Type:General  Level of Consciousness:  sedated, patient cooperative and responds to stimulation  Airway & Oxygen Therapy:Patient Spontanous Breathing and Patient connected to face mask oxgen  Post-op Assessment:  Report given to PACU RN and Post -op Vital signs reviewed and stable  Post vital signs:  Reviewed and stable  Last Vitals:  Filed Vitals:   07/04/15 0538  BP: 147/67  Pulse: 58  Temp: 36.3 C  Resp: 16    Complications: No apparent anesthesia complications

## 2015-07-04 NOTE — Op Note (Signed)
Preoperative diagnosis: BPH, prostate cancer, urinary retention, bladder outlet obstruction Postoperative diagnosis: Same  Procedure: Greenlight photo vaporization of the prostate  Surgeon: Natoshia Souter  Type of anesthesia: Gen.  Indication for procedure: 81 year old with high-risk prostate cancer on androgen deprivation with continued urinary retention. Plan is for outlet procedure followed by definitive radiotherapy once stable and voiding.  Findings: On exam under anesthesia the penis was circumcised and without mass or lesion. The testicles were descended bilaterally and palpably normal. On rectal exam the prostate was overall smooth without obvious hard area or nodule. Landmarks were preserved.  On cystoscopy the urethra was normal, there was trilobar hypertrophy, there is bladder trabeculation but overall the bladder mucosa was unremarkable. There is very little inflammation even after prolonged Foley catheter. The trigone and ureteral orifices appeared normal before-and-after vaporization.  Description of procedure: After consent was obtained patient brought to the operating room. After adequate anesthesia he is placed in lithotomy position and prepped and draped in usual sterile fashion. A timeout was performed to confirm the patient and procedure. Cystoscope was passed per urethra and the bladder carefully inspected. The laser fiber was passed in a setting of 68 W I made incisions at 5:00 and 7:00 down to the bladder neck and then brought these incisions down into the typical hockey-stick fashion in the apex. Power was turned up to 140 and a laser vaporized the median lobe. I then worked anterior to posterior bladder neck down the apex and back vaporizing the right lateral lobe and in the left lateral lobe. This created an excellent channel even under low pressure with excellent hemostasis. Again the trigone and ureteral orifices were inspected and noted to be normal. The scope was very mobile  within the prostatic urethral and having a good channel the case with felt to be a completion. The bladder was filled and the scope removed. An 56 French coud catheter was placed. 15 mL was placed in the balloon was seated at the bladder neck. Urine drainage was clear. The patient was awakened and taken to recovery room in stable condition.   Complications: None   Blood loss: Minimal   Specimens: None   Drains: 22 French coud catheter   Disposition: Patient stable to PACU

## 2015-07-04 NOTE — Interval H&P Note (Signed)
History and Physical Interval Note:  07/04/2015 7:18 AM  Austin Nicholson  has presented today for surgery, with the diagnosis of BPH PROSTATE CANCER URINARY RETENTION   The various methods of treatment have been discussed with the patient and family. After consideration of risks, benefits and other options for treatment, the patient has consented to  Procedure(s): GREEN LIGHT PHOTO VAPORILIZATION LASER,  POSSIBLE  TURP (TRANSURETHRAL RESECTION OF PROSTATE (N/A) as a surgical intervention .  The patient's history has been reviewed, patient examined, no change in status, stable for surgery.  I have reviewed the patient's chart and labs.  Questions were answered to the patient's satisfaction.  He is well. We called and confirmed he started his abx. He has no complaints.    Ailany Koren

## 2015-07-04 NOTE — Anesthesia Preprocedure Evaluation (Addendum)
Anesthesia Evaluation  Patient identified by MRN, date of birth, ID band Patient awake    Reviewed: Allergy & Precautions, NPO status , Patient's Chart, lab work & pertinent test results  Airway Mallampati: II  TM Distance: >3 FB Neck ROM: Full    Dental no notable dental hx. (+) Poor Dentition, Missing,    Pulmonary neg pulmonary ROS,  breath sounds clear to auscultation  Pulmonary exam normal       Cardiovascular hypertension, Pt. on medications negative cardio ROS Normal cardiovascular examRhythm:Regular Rate:Normal     Neuro/Psych negative neurological ROS  negative psych ROS   GI/Hepatic negative GI ROS, Neg liver ROS,   Endo/Other  negative endocrine ROS  Renal/GU Renal InsufficiencyRenal diseasenegative Renal ROS  negative genitourinary   Musculoskeletal negative musculoskeletal ROS (+)   Abdominal   Peds negative pediatric ROS (+)  Hematology negative hematology ROS (+)   Anesthesia Other Findings   Reproductive/Obstetrics negative OB ROS                            Anesthesia Physical Anesthesia Plan  ASA: II  Anesthesia Plan: General   Post-op Pain Management:    Induction: Intravenous  Airway Management Planned: LMA  Additional Equipment:   Intra-op Plan:   Post-operative Plan: Extubation in OR  Informed Consent: I have reviewed the patients History and Physical, chart, labs and discussed the procedure including the risks, benefits and alternatives for the proposed anesthesia with the patient or authorized representative who has indicated his/her understanding and acceptance.   Dental advisory given  Plan Discussed with: CRNA  Anesthesia Plan Comments:         Anesthesia Quick Evaluation

## 2015-07-04 NOTE — Discharge Instructions (Signed)
Foley Catheter Care °A Foley catheter is a soft, flexible tube that is placed into the bladder to drain urine. A Foley catheter may be inserted if: °· You leak urine or are not able to control when you urinate (urinary incontinence). °· You are not able to urinate when you need to (urinary retention). °· You had prostate surgery or surgery on the genitals. °· You have certain medical conditions, such as multiple sclerosis, dementia, or a spinal cord injury. °If you are going home with a Foley catheter in place, follow the instructions below. °TAKING CARE OF THE CATHETER °1. Wash your hands with soap and water. °2. Using mild soap and warm water on a clean washcloth: °· Clean the area on your body closest to the catheter insertion site using a circular motion, moving away from the catheter. Never wipe toward the catheter because this could sweep bacteria up into the urethra and cause infection. °· Remove all traces of soap. Pat the area dry with a clean towel. For males, reposition the foreskin. °3. Attach the catheter to your leg so there is no tension on the catheter. Use adhesive tape or a leg strap. If you are using adhesive tape, remove any sticky residue left behind by the previous tape you used. °4. Keep the drainage bag below the level of the bladder, but keep it off the floor. °5. Check throughout the day to be sure the catheter is working and urine is draining freely. Make sure the tubing does not become kinked. °6. Do not pull on the catheter or try to remove it. Pulling could damage internal tissues. °TAKING CARE OF THE DRAINAGE BAGS °You will be given two drainage bags to take home. One is a large overnight drainage bag, and the other is a smaller leg bag that fits underneath clothing. You may wear the overnight bag at any time, but you should never wear the smaller leg bag at night. Follow the instructions below for how to empty, change, and clean your drainage bags. °Emptying the Drainage Bag °You must  empty your drainage bag when it is  -½ full or at least 2-3 times a day. °1. Wash your hands with soap and water. °2. Keep the drainage bag below your hips, below the level of your bladder. This stops urine from going back into the tubing and into your bladder. °3. Hold the dirty bag over the toilet or a clean container. °4. Open the pour spout at the bottom of the bag and empty the urine into the toilet or container. Do not let the pour spout touch the toilet, container, or any other surface. Doing so can place bacteria on the bag, which can cause an infection. °5. Clean the pour spout with a gauze pad or cotton ball that has rubbing alcohol on it. °6. Close the pour spout. °7. Attach the bag to your leg with adhesive tape or a leg strap. °8. Wash your hands well. °Changing the Drainage Bag °Change your drainage bag once a month or sooner if it starts to smell bad or look dirty. Below are steps to follow when changing the drainage bag. °1. Wash your hands with soap and water. °2. Pinch off the rubber catheter so that urine does not spill out. °3. Disconnect the catheter tube from the drainage tube at the connection valve. Do not let the tubes touch any surface. °4. Clean the end of the catheter tube with an alcohol wipe. Use a different alcohol wipe to clean the   end of the drainage tube. 5. Connect the catheter tube to the drainage tube of the clean drainage bag. 6. Attach the new bag to the leg with adhesive tape or a leg strap. Avoid attaching the new bag too tightly. 7. Wash your hands well. Cleaning the Drainage Bag 1. Wash your hands with soap and water. 2. Wash the bag in warm, soapy water. 3. Rinse the bag thoroughly with warm water. 4. Fill the bag with a solution of white vinegar and water (1 cup vinegar to 1 qt warm water [.2 L vinegar to 1 L warm water]). Close the bag and soak it for 30 minutes in the solution. 5. Rinse the bag with warm water. 6. Hang the bag to dry with the pour spout open  and hanging downward. 7. Store the clean bag (once it is dry) in a clean plastic bag. 8. Wash your hands well. PREVENTING INFECTION  Wash your hands before and after handling your catheter.  Take showers daily and wash the area where the catheter enters your body. Do not take baths. Replace wet leg straps with dry ones, if this applies.  Do not use powders, sprays, or lotions on the genital area. Only use creams, lotions, or ointments as directed by your caregiver.  For females, wipe from front to back after each bowel movement.  Drink enough fluids to keep your urine clear or pale yellow unless you have a fluid restriction.  Do not let the drainage bag or tubing touch or lie on the floor.  Wear cotton underwear to absorb moisture and to keep your skin drier. SEEK MEDICAL CARE IF:   Your urine is cloudy or smells unusually bad.  Your catheter becomes clogged.  You are not draining urine into the bag or your bladder feels full.  Your catheter starts to leak. SEEK IMMEDIATE MEDICAL CARE IF:   You have pain, swelling, redness, or pus where the catheter enters the body.  You have pain in the abdomen, legs, lower back, or bladder.  You have a fever.  You see blood fill the catheter, or your urine is pink or red.  You have nausea, vomiting, or chills.  Your catheter gets pulled out. MAKE SURE YOU:   Understand these instructions.  Will watch your condition.  Will get help right away if you are not doing well or get worse. Document Released: 11/25/2005 Document Revised: 04/11/2014 Document Reviewed: 11/16/2012 [facility] Patient Information 2015 [place], [place]. This information is not intended to replace advice given to you by your health care provider. Make sure you discuss any questions you have with your health care provider. Prostate Laser Surgery, Care After Refer to this sheet in the next few weeks. These instructions provide you with information on caring for yourself  after your procedure. Your health care provider may also give you more specific instructions. Your treatment has been planned according to current medical practices, but problems sometimes occur. Call your health care provider if you have any problems or questions after your procedure. WHAT TO EXPECT AFTER THE PROCEDURE  You may notice blood in your urine that could last up to 3 weeks.  After your catheter is removed, you will have burning (especially at the tip of your penis) when you urinate, especially at the end of urination. For the first few weeks after your procedure, you will feel the need to urinate often. HOME CARE INSTRUCTIONS  7. Do not perform vigorous exercise, especially heavy lifting, for 1 week or as  directed by your health care provider. 8. Avoid sexual activity for 4-6 weeks or as directed by your health care provider. 9. Do not ride in a car for extended periods for 1 month or as directed by your health care provider. 10. Do not strain to have a bowel movement. Drink a lot of fluids and and make sure you get enough fiber in your diet. 11. Drink enough fluids to keep your urine clear or pale yellow. SEEK IMMEDIATE MEDICAL CARE IF:  9. Your catheter has been removed and you are suddenly unable to urinate. 10. Your catheter has not been removed, and it develops a blockage. 11. You start to have blood clots in your urine. 12. The blood in your urine becomes persistent or gets thick. 13. Your temperature is greater than 100.64F (38.1C). 14. You develop chest pains. 15. You develop shortness of breath. 16. You develop leg swelling or pain. MAKE SURE YOU: 8. Understand these instructions. 9. Will watch your condition. 10. Will get help right away if you are not doing well or get worse. Document Released: 11/25/2005 Document Revised: 11/30/2013 Document Reviewed: 05/17/2013 [facility] Patient Information 2015 [place], [place]. This information is not intended to replace advice  given to you by your health care provider. Make sure you discuss any questions you have with your health care provider.

## 2015-07-04 NOTE — Anesthesia Procedure Notes (Signed)
Procedure Name: LMA Insertion Performed by: Anne Fu Pre-anesthesia Checklist: Patient identified, Emergency Drugs available, Suction available, Patient being monitored and Timeout performed Patient Re-evaluated:Patient Re-evaluated prior to inductionOxygen Delivery Method: Circle system utilized Preoxygenation: Pre-oxygenation with 100% oxygen Intubation Type: IV induction Ventilation: Mask ventilation without difficulty LMA: LMA inserted LMA Size: 4.0 Number of attempts: 1 Placement Confirmation: positive ETCO2 and breath sounds checked- equal and bilateral Tube secured with: Tape

## 2015-07-04 NOTE — Care Management Note (Signed)
Case Management Note  Patient Details  Name: Austin Nicholson MRN: [id number] Date of Birth: 20-Jul-1928  Subjective/Objective: 81 y/o m admitted w/Prostate Ca. S/p vaporization. From home.                   Action/Plan:d/c plan home. No anticipated d/c needs.   Expected Discharge Date:                 Expected Discharge Plan:  Home/Self Care  In-House Referral:     Discharge planning Services  CM Consult  Post Acute Care Choice:    Choice offered to:     DME Arranged:    DME Agency:     HH Arranged:    HH Agency:     Status of Service:  In process, will continue to follow  Medicare Important Message Given:    Date Medicare IM Given:    Medicare IM give by:    Date Additional Medicare IM Given:    Additional Medicare Important Message give by:     If discussed at [place] of Stay Meetings, dates discussed:    Additional Comments:  Dessa Phi, RN 07/04/2015, 12:51 PM

## 2015-07-04 NOTE — Anesthesia Postprocedure Evaluation (Signed)
  Anesthesia Post-op Note  Patient: Austin Nicholson  Procedure(s) Performed: Procedure(s) (LRB): GREEN LIGHT PHOTO VAPORILIZATION LASER,   (N/A)  Patient Location: PACU  Anesthesia Type: General  Level of Consciousness: awake and alert   Airway and Oxygen Therapy: Patient Spontanous Breathing  Post-op Pain: mild  Post-op Assessment: Post-op Vital signs reviewed, Patient's Cardiovascular Status Stable, Respiratory Function Stable, Patent Airway and No signs of Nausea or vomiting  Last Vitals:  Filed Vitals:   07/04/15 0538  BP: 147/67  Pulse: 58  Temp: 36.3 C  Resp: 16    Post-op Vital Signs: stable   Complications: No apparent anesthesia complications

## 2015-07-05 ENCOUNTER — Encounter (HOSPITAL_COMMUNITY): Payer: Self-pay | Admitting: Urology

## 2015-07-05 DIAGNOSIS — N3001 Acute cystitis with hematuria: Secondary | ICD-10-CM | POA: Diagnosis not present

## 2015-07-05 DIAGNOSIS — T8351XA Infection and inflammatory reaction due to indwelling urinary catheter, initial encounter: Secondary | ICD-10-CM | POA: Diagnosis not present

## 2015-07-05 DIAGNOSIS — R319 Hematuria, unspecified: Secondary | ICD-10-CM | POA: Diagnosis not present

## 2015-07-05 DIAGNOSIS — A419 Sepsis, unspecified organism: Secondary | ICD-10-CM | POA: Diagnosis not present

## 2015-07-05 DIAGNOSIS — Z594 Lack of adequate food and safe drinking water: Secondary | ICD-10-CM | POA: Diagnosis not present

## 2015-07-05 DIAGNOSIS — N401 Enlarged prostate with lower urinary tract symptoms: Secondary | ICD-10-CM | POA: Diagnosis not present

## 2015-07-05 DIAGNOSIS — I129 Hypertensive chronic kidney disease with stage 1 through stage 4 chronic kidney disease, or unspecified chronic kidney disease: Secondary | ICD-10-CM | POA: Diagnosis not present

## 2015-07-05 DIAGNOSIS — R404 Transient alteration of awareness: Secondary | ICD-10-CM | POA: Diagnosis not present

## 2015-07-05 DIAGNOSIS — N138 Other obstructive and reflux uropathy: Secondary | ICD-10-CM | POA: Diagnosis not present

## 2015-07-05 DIAGNOSIS — N183 Chronic kidney disease, stage 3 (moderate): Secondary | ICD-10-CM | POA: Diagnosis not present

## 2015-07-05 DIAGNOSIS — I10 Essential (primary) hypertension: Secondary | ICD-10-CM | POA: Diagnosis not present

## 2015-07-05 DIAGNOSIS — R531 Weakness: Secondary | ICD-10-CM | POA: Diagnosis not present

## 2015-07-05 DIAGNOSIS — R509 Fever, unspecified: Secondary | ICD-10-CM | POA: Diagnosis not present

## 2015-07-05 DIAGNOSIS — G9341 Metabolic encephalopathy: Secondary | ICD-10-CM | POA: Diagnosis not present

## 2015-07-05 DIAGNOSIS — C61 Malignant neoplasm of prostate: Secondary | ICD-10-CM | POA: Diagnosis not present

## 2015-07-05 DIAGNOSIS — Z85828 Personal history of other malignant neoplasm of skin: Secondary | ICD-10-CM | POA: Diagnosis not present

## 2015-07-05 DIAGNOSIS — N390 Urinary tract infection, site not specified: Secondary | ICD-10-CM | POA: Diagnosis not present

## 2015-07-05 DIAGNOSIS — R338 Other retention of urine: Secondary | ICD-10-CM | POA: Diagnosis not present

## 2015-07-05 NOTE — Discharge Summary (Signed)
Physician Discharge Summary  Patient ID: Austin Nicholson MRN: [id number] DOB/AGE: [birth year] 80 y.o.  Admit date: 07/04/2015 Discharge date: 07/05/2015  Admission Diagnoses:  Discharge Diagnoses:  Active Problems:   BPH with obstruction/lower urinary tract symptoms   Discharged Condition: good  Hospital Course: Patient admitted for observation following general anesthesia and greenlight photovaporization of prostate as he lives alone. He is stable and ready for discharge. He has a lesson at 4 pm today and he is eager to get home.   Consults: None  Significant Diagnostic Studies: none  Treatments: surgery: greenlight photovaporization of prostate  Discharge Exam: Blood pressure 122/64, pulse 76, temperature 99 F (37.2 C), temperature source Oral, resp. rate 18, height 5\' 6"  (1.676 m), weight 78.019 kg (172 lb), SpO2 96 %. Watching TV Urine clear in tubing  NAD, A&Ox3  Disposition: 01-Home or Self Care  Discharge Instructions    Discharge patient    Complete by:  As directed   To home with foley catheter to gravity drainage.            Medication List    STOP taking these medications        nitrofurantoin 100 MG capsule  Commonly known as:  MACRODANTIN      TAKE these medications        amLODipine 5 MG tablet  Commonly known as:  NORVASC  Take 1 tablet (5 mg total) by mouth daily.     nitrofurantoin (macrocrystal-monohydrate) 100 MG capsule  Commonly known as:  MACROBID  Take 100 mg by mouth 2 (two) times daily. Pre-op antibiotic           Follow-up Information    Follow up with Festus Aloe, MD On 07/18/2015.   Specialty:  Urology   Why:  10:15 AM    Contact information:   [place] [address] [phone]       Signed: Festus Aloe 07/05/2015, 11:39 AM

## 2015-07-05 NOTE — Care Management Note (Signed)
Case Management Note  Patient Details  Name: NEVILLE WALSTON MRN: [id number] Date of Birth: [birth year]  Subjective/Objective: Received referral for financial issues. 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                   Action/Plan:d/c home no needs or orders.   Expected Discharge Date:  07/05/15               Expected Discharge Plan:  Home/Self Care  In-House Referral:     Discharge planning Services  CM Consult  Post Acute Care Choice:    Choice offered to:     DME Arranged:    DME Agency:     HH Arranged:    HH Agency:     Status of Service:  Completed, signed off  Medicare Important Message Given:    Date Medicare IM Given:    Medicare IM give by:    Date Additional Medicare IM Given:    Additional Medicare Important Message give by:     If discussed at [place] of Stay Meetings, dates discussed:    Additional Comments:  Dessa Phi, RN 07/05/2015, 12:13 PM

## 2015-07-06 DIAGNOSIS — T8351XA Infection and inflammatory reaction due to indwelling urinary catheter, initial encounter: Secondary | ICD-10-CM | POA: Diagnosis present

## 2015-07-06 DIAGNOSIS — N3000 Acute cystitis without hematuria: Secondary | ICD-10-CM | POA: Diagnosis not present

## 2015-07-06 DIAGNOSIS — C61 Malignant neoplasm of prostate: Secondary | ICD-10-CM | POA: Diagnosis present

## 2015-07-06 DIAGNOSIS — I129 Hypertensive chronic kidney disease with stage 1 through stage 4 chronic kidney disease, or unspecified chronic kidney disease: Secondary | ICD-10-CM | POA: Diagnosis present

## 2015-07-06 DIAGNOSIS — N3001 Acute cystitis with hematuria: Secondary | ICD-10-CM | POA: Diagnosis present

## 2015-07-06 DIAGNOSIS — G9341 Metabolic encephalopathy: Secondary | ICD-10-CM | POA: Diagnosis present

## 2015-07-06 DIAGNOSIS — A419 Sepsis, unspecified organism: Secondary | ICD-10-CM | POA: Diagnosis present

## 2015-07-06 DIAGNOSIS — R319 Hematuria, unspecified: Secondary | ICD-10-CM | POA: Diagnosis not present

## 2015-07-06 DIAGNOSIS — N390 Urinary tract infection, site not specified: Secondary | ICD-10-CM | POA: Diagnosis not present

## 2015-07-06 DIAGNOSIS — Z594 Lack of adequate food and safe drinking water: Secondary | ICD-10-CM | POA: Diagnosis not present

## 2015-07-06 DIAGNOSIS — N183 Chronic kidney disease, stage 3 (moderate): Secondary | ICD-10-CM | POA: Diagnosis present

## 2015-07-10 ENCOUNTER — Encounter: Payer: Self-pay | Admitting: Emergency Medicine

## 2015-07-10 DIAGNOSIS — N183 Chronic kidney disease, stage 3 (moderate): Secondary | ICD-10-CM | POA: Diagnosis not present

## 2015-07-10 DIAGNOSIS — I129 Hypertensive chronic kidney disease with stage 1 through stage 4 chronic kidney disease, or unspecified chronic kidney disease: Secondary | ICD-10-CM | POA: Diagnosis not present

## 2015-07-10 DIAGNOSIS — N390 Urinary tract infection, site not specified: Secondary | ICD-10-CM | POA: Diagnosis not present

## 2015-07-10 DIAGNOSIS — C61 Malignant neoplasm of prostate: Secondary | ICD-10-CM | POA: Diagnosis not present

## 2015-07-11 DIAGNOSIS — N183 Chronic kidney disease, stage 3 (moderate): Secondary | ICD-10-CM | POA: Diagnosis not present

## 2015-07-11 DIAGNOSIS — I129 Hypertensive chronic kidney disease with stage 1 through stage 4 chronic kidney disease, or unspecified chronic kidney disease: Secondary | ICD-10-CM | POA: Diagnosis not present

## 2015-07-11 DIAGNOSIS — C61 Malignant neoplasm of prostate: Secondary | ICD-10-CM | POA: Diagnosis not present

## 2015-07-11 DIAGNOSIS — N390 Urinary tract infection, site not specified: Secondary | ICD-10-CM | POA: Diagnosis not present

## 2015-07-13 DIAGNOSIS — C61 Malignant neoplasm of prostate: Secondary | ICD-10-CM | POA: Diagnosis not present

## 2015-07-13 DIAGNOSIS — N183 Chronic kidney disease, stage 3 (moderate): Secondary | ICD-10-CM | POA: Diagnosis not present

## 2015-07-13 DIAGNOSIS — I129 Hypertensive chronic kidney disease with stage 1 through stage 4 chronic kidney disease, or unspecified chronic kidney disease: Secondary | ICD-10-CM | POA: Diagnosis not present

## 2015-07-13 DIAGNOSIS — N390 Urinary tract infection, site not specified: Secondary | ICD-10-CM | POA: Diagnosis not present

## 2015-07-14 ENCOUNTER — Inpatient Hospital Stay: Payer: Medicare Other | Admitting: Family Medicine

## 2015-07-17 DIAGNOSIS — N183 Chronic kidney disease, stage 3 (moderate): Secondary | ICD-10-CM | POA: Diagnosis not present

## 2015-07-17 DIAGNOSIS — N390 Urinary tract infection, site not specified: Secondary | ICD-10-CM | POA: Diagnosis not present

## 2015-07-17 DIAGNOSIS — C61 Malignant neoplasm of prostate: Secondary | ICD-10-CM | POA: Diagnosis not present

## 2015-07-17 DIAGNOSIS — I129 Hypertensive chronic kidney disease with stage 1 through stage 4 chronic kidney disease, or unspecified chronic kidney disease: Secondary | ICD-10-CM | POA: Diagnosis not present

## 2015-07-19 DIAGNOSIS — N3001 Acute cystitis with hematuria: Secondary | ICD-10-CM | POA: Diagnosis not present

## 2015-07-19 DIAGNOSIS — N390 Urinary tract infection, site not specified: Secondary | ICD-10-CM | POA: Diagnosis not present

## 2015-07-19 DIAGNOSIS — G9341 Metabolic encephalopathy: Secondary | ICD-10-CM | POA: Diagnosis not present

## 2015-07-19 DIAGNOSIS — I129 Hypertensive chronic kidney disease with stage 1 through stage 4 chronic kidney disease, or unspecified chronic kidney disease: Secondary | ICD-10-CM | POA: Diagnosis not present

## 2015-07-19 DIAGNOSIS — C61 Malignant neoplasm of prostate: Secondary | ICD-10-CM | POA: Diagnosis not present

## 2015-07-19 DIAGNOSIS — N183 Chronic kidney disease, stage 3 (moderate): Secondary | ICD-10-CM | POA: Diagnosis not present

## 2015-07-19 DIAGNOSIS — M6281 Muscle weakness (generalized): Secondary | ICD-10-CM | POA: Diagnosis not present

## 2015-07-20 DIAGNOSIS — C61 Malignant neoplasm of prostate: Secondary | ICD-10-CM | POA: Diagnosis not present

## 2015-07-20 DIAGNOSIS — N183 Chronic kidney disease, stage 3 (moderate): Secondary | ICD-10-CM | POA: Diagnosis not present

## 2015-07-20 DIAGNOSIS — I129 Hypertensive chronic kidney disease with stage 1 through stage 4 chronic kidney disease, or unspecified chronic kidney disease: Secondary | ICD-10-CM | POA: Diagnosis not present

## 2015-07-20 DIAGNOSIS — N390 Urinary tract infection, site not specified: Secondary | ICD-10-CM | POA: Diagnosis not present

## 2015-07-25 DIAGNOSIS — N183 Chronic kidney disease, stage 3 (moderate): Secondary | ICD-10-CM | POA: Diagnosis not present

## 2015-07-25 DIAGNOSIS — C61 Malignant neoplasm of prostate: Secondary | ICD-10-CM | POA: Diagnosis not present

## 2015-07-25 DIAGNOSIS — I129 Hypertensive chronic kidney disease with stage 1 through stage 4 chronic kidney disease, or unspecified chronic kidney disease: Secondary | ICD-10-CM | POA: Diagnosis not present

## 2015-07-25 DIAGNOSIS — N390 Urinary tract infection, site not specified: Secondary | ICD-10-CM | POA: Diagnosis not present

## 2015-08-03 DIAGNOSIS — R339 Retention of urine, unspecified: Secondary | ICD-10-CM | POA: Diagnosis not present

## 2015-08-03 DIAGNOSIS — R351 Nocturia: Secondary | ICD-10-CM | POA: Diagnosis not present

## 2015-08-11 DIAGNOSIS — N401 Enlarged prostate with lower urinary tract symptoms: Secondary | ICD-10-CM | POA: Diagnosis not present

## 2015-08-11 DIAGNOSIS — R339 Retention of urine, unspecified: Secondary | ICD-10-CM | POA: Diagnosis not present

## 2015-08-11 DIAGNOSIS — N138 Other obstructive and reflux uropathy: Secondary | ICD-10-CM | POA: Diagnosis not present

## 2015-08-15 DIAGNOSIS — N183 Chronic kidney disease, stage 3 (moderate): Secondary | ICD-10-CM | POA: Diagnosis not present

## 2015-08-15 DIAGNOSIS — N390 Urinary tract infection, site not specified: Secondary | ICD-10-CM | POA: Diagnosis not present

## 2015-08-15 DIAGNOSIS — I129 Hypertensive chronic kidney disease with stage 1 through stage 4 chronic kidney disease, or unspecified chronic kidney disease: Secondary | ICD-10-CM | POA: Diagnosis not present

## 2015-08-15 DIAGNOSIS — C61 Malignant neoplasm of prostate: Secondary | ICD-10-CM | POA: Diagnosis not present

## 2015-09-19 DIAGNOSIS — C61 Malignant neoplasm of prostate: Secondary | ICD-10-CM | POA: Diagnosis not present

## 2015-09-19 DIAGNOSIS — N390 Urinary tract infection, site not specified: Secondary | ICD-10-CM | POA: Diagnosis not present

## 2015-10-11 ENCOUNTER — Encounter: Payer: Self-pay | Admitting: Medical Oncology

## 2015-10-11 NOTE — Progress Notes (Signed)
Oncology Nurse Navigator Documentation  Oncology Nurse Navigator Flowsheets 10/11/2015  Navigator Encounter Type Other  Patient Visit Type Follow-up- I met Austin Nicholson in December in the [facility]. He has not been able to start his radiation treatments due to urinary retention. He had a TURP and is presently voiding without major issues. He continues his ADT and will follow up with Dr. Junious Silk in January. If he continues to void without issues he will be referred back to be evaluated for radiation.  Treatment Phase Other  Time Spent with Patient 15

## 2015-11-16 DIAGNOSIS — R338 Other retention of urine: Secondary | ICD-10-CM | POA: Diagnosis not present

## 2015-11-16 DIAGNOSIS — N401 Enlarged prostate with lower urinary tract symptoms: Secondary | ICD-10-CM | POA: Diagnosis not present

## 2015-11-16 DIAGNOSIS — N138 Other obstructive and reflux uropathy: Secondary | ICD-10-CM | POA: Diagnosis not present

## 2015-11-16 DIAGNOSIS — C61 Malignant neoplasm of prostate: Secondary | ICD-10-CM | POA: Diagnosis not present

## 2015-11-21 ENCOUNTER — Other Ambulatory Visit: Payer: Self-pay | Admitting: Urology

## 2015-11-23 ENCOUNTER — Encounter (HOSPITAL_COMMUNITY): Payer: Self-pay

## 2015-11-23 ENCOUNTER — Telehealth: Payer: Self-pay | Admitting: Family Medicine

## 2015-11-23 ENCOUNTER — Encounter (HOSPITAL_COMMUNITY)
Admission: RE | Admit: 2015-11-23 | Discharge: 2015-11-23 | Disposition: A | Discharge status: Home / Self Care | Payer: Medicare Other | Source: Ambulatory Visit | Attending: Urology | Admitting: Urology

## 2015-11-23 DIAGNOSIS — Z01818 Encounter for other preprocedural examination: Secondary | ICD-10-CM | POA: Diagnosis not present

## 2015-11-23 DIAGNOSIS — C61 Malignant neoplasm of prostate: Secondary | ICD-10-CM | POA: Insufficient documentation

## 2015-11-23 LAB — CBC
HCT: 37.5 % — ABNORMAL LOW (ref 39.0–52.0)
Hemoglobin: 12.6 g/dL — ABNORMAL LOW (ref 13.0–17.0)
MCH: 28.8 pg (ref 26.0–34.0)
MCHC: 33.6 g/dL (ref 30.0–36.0)
MCV: 85.6 fL (ref 78.0–100.0)
PLATELETS: 250 10*3/uL (ref 150–400)
RBC: 4.38 MIL/uL (ref 4.22–5.81)
RDW: 13 % (ref 11.5–15.5)
WBC: 7.1 10*3/uL (ref 4.0–10.5)

## 2015-11-23 LAB — BASIC METABOLIC PANEL
Anion gap: 10 (ref 5–15)
BUN: 26 mg/dL — AB (ref 6–20)
CHLORIDE: 107 mmol/L (ref 101–111)
CO2: 25 mmol/L (ref 22–32)
CREATININE: 1.8 mg/dL — AB (ref 0.61–1.24)
Calcium: 9.2 mg/dL (ref 8.9–10.3)
GFR calc Af Amer: 38 mL/min — ABNORMAL LOW (ref >60)
GFR calc non Af Amer: 33 mL/min — ABNORMAL LOW (ref >60)
GLUCOSE: 94 mg/dL (ref 65–99)
Potassium: 4.2 mmol/L (ref 3.5–5.1)
SODIUM: 142 mmol/L (ref 135–145)

## 2015-11-23 NOTE — Telephone Encounter (Signed)
Helyn App from [facility] called stating that pt. Is going to have surgery on 12/05/15 and pt. BP was 165/125 and then it was 153/108. Pt. Stated he has been out of his medication for for two weeks  and would like a refill on amLODipine (NORVASC) 5 MG tablet. He would like his medication to be sent to [facility] on [address], he can be reached at [phone]. Please f/u.

## 2015-11-23 NOTE — Patient Instructions (Addendum)
[place]  11/23/2015   Your procedure is scheduled on:   12-05-2015 Tuesday  Enter through [facility]  Entrance and follow signs to [employer] to [place]. Arrive at    0700    AM.  (Limit 1 person with you).  Call this number if you have problems the morning of surgery: [phone]  Or Presurgical Testing [phone] days before.   For Living Will and/or Health Care Power Attorney Forms: please provide copy for your medical record,may bring AM of surgery(Forms should be already notarized -we do not provide this service).( No information preferred today).  Remember: Follow any bowel prep instructions per MD office.    Do not eat food/ or drink: After Midnight.      Take these medicines the morning of surgery with A SIP OF WATER-   (DO NOT TAKE ANY DIABETIC MEDS AM OF SURGERY) : Amlodipine(please check with your pharmacy to pick up medication- contact  Dr. Adrian Blackwater office [phone]- any problems) .   Do not wear jewelry, make-up or nail polish.  Do not wear deodorant, lotions, powders, or perfumes.   Do not shave legs and under arms- 48 hours(2 days) prior to first CHG shower.(Shaving face and neck okay.)  Do not bring valuables to the hospital.(Hospital is not responsible for lost valuables).  Contacts, dentures or removable bridgework, body piercing, hair pins may not be worn into surgery.  Leave suitcase in the car. After surgery it may be brought to your room.  For patients admitted to the hospital, checkout time is 11:00 AM the day of discharge.(Restricted visitors-Any Persons displaying flu-like symptoms or illness).    Patients discharged the day of surgery will not be allowed to drive home. Must have responsible person with you x 24 hours once discharged.  Name and phone number of your driver: Sherrlyn Hock -friend [phone] home.     Please read over the following fact sheets that you were given:  CHG(Chlorhexidine Gluconate 4%  Surgical Soap) use.         [facility] - Preparing for Surgery Before surgery, you can play an important role.  Because skin is not sterile, your skin needs to be as free of germs as possible.  You can reduce the number of germs on your skin by washing with CHG (chlorahexidine gluconate) soap before surgery.  CHG is an antiseptic cleaner which kills germs and bonds with the skin to continue killing germs even after washing. Please DO NOT use if you have an allergy to CHG or antibacterial soaps.  If your skin becomes reddened/irritated stop using the CHG and inform your nurse when you arrive at Short Stay. Do not shave (including legs and underarms) for at least 48 hours prior to the first CHG shower.  You may shave your face/neck. Please follow these instructions carefully:  1.  Shower with CHG Soap the night before surgery and the  morning of Surgery.  2.  If you choose to wash your hair, wash your hair first as usual with your  normal  shampoo.  3.  After you shampoo, rinse your hair and body thoroughly to remove the  shampoo.                           4.  Use CHG as you would any other liquid soap.  You can apply chg directly  to the skin and wash  Gently with a scrungie or clean washcloth.  5.  Apply the CHG Soap to your body ONLY FROM THE NECK DOWN.   Do not use on face/ open                           Wound or open sores. Avoid contact with eyes, ears mouth and genitals (private parts).                       Wash face,  Genitals (private parts) with your normal soap.             6.  Wash thoroughly, paying special attention to the area where your surgery  will be performed.  7.  Thoroughly rinse your body with warm water from the neck down.  8.  DO NOT shower/wash with your normal soap after using and rinsing off  the CHG Soap.                9.  Pat yourself dry with a clean towel.            10.  Wear clean pajamas.            11.  Place clean sheets on your bed the  night of your first shower and do not  sleep with pets. Day of Surgery : Do not apply any lotions/deodorants the morning of surgery.  Please wear clean clothes to the hospital/surgery center.  FAILURE TO FOLLOW THESE INSTRUCTIONS MAY RESULT IN THE CANCELLATION OF YOUR SURGERY PATIENT SIGNATURE_________________________________  NURSE SIGNATURE__________________________________  ________________________________________________________________________

## 2015-11-23 NOTE — Pre-Procedure Instructions (Addendum)
11-23-15 EKG 7'16/ CXR 8'15 Epic.  11-23-15 On arrival to PAT visit pt BP 165/125, rechecked after 10 minutes 153/108- pt. Questioned as to whether taking blood pressure med, "states out of med- not taken in 2 weeks" -Pharmacy was to contact MD to get med refilled and has not been able to get med. Call by me today into Dr. Valentina Lucks office- spoke to "Roann"- she was going to forward information to nurse to call in Med to [place], [address], [place], [place]. Pt to contact PCP office for any additional problems.

## 2015-11-24 NOTE — Progress Notes (Signed)
11-24-15 0910 Labs viewable in Epic- note BUN.

## 2015-11-28 ENCOUNTER — Other Ambulatory Visit: Payer: Self-pay | Admitting: *Deleted

## 2015-11-28 ENCOUNTER — Telehealth: Payer: Self-pay | Admitting: Family Medicine

## 2015-11-28 DIAGNOSIS — I10 Essential (primary) hypertension: Secondary | ICD-10-CM

## 2015-11-28 MED ORDER — AMLODIPINE BESYLATE 5 MG PO TABS: 5.0000 mg | ORAL_TABLET | Freq: Every day | ORAL | Status: DC

## 2015-11-28 NOTE — Telephone Encounter (Signed)
Not a valid phone number for pharmacy  Unable to contact pt. Call Pt x 2, call disconnected  Left voice message to nurse Lorriane Shire  LVM to return call at emergency number   Refill send to [place]. In [place] Pt need to transfer to pharmacy of choice

## 2015-11-28 NOTE — Telephone Encounter (Signed)
Lorriane Shire from [facility] called stating that patient had a fall and was then diagnosed with high blood pressure. She would like to know if patient can get a medication refill sent to:    [facility] [address] [place], [place] [phone]

## 2015-11-29 NOTE — Telephone Encounter (Signed)
[facility] in [place] was called to transfer Rx Norvasc  To [place] at [phone]

## 2015-11-29 NOTE — Addendum Note (Signed)
Addended by: Betti Cruz on: 11/29/2015 10:00 AM   Modules accepted: Medications

## 2015-12-04 NOTE — Anesthesia Preprocedure Evaluation (Addendum)
Anesthesia Evaluation  Patient identified by MRN, date of birth, ID band Patient awake    Reviewed: Allergy & Precautions, NPO status , Patient's Chart, lab work & pertinent test results  Airway Mallampati: II  TM Distance: >3 FB Neck ROM: Full    Dental  (+) Missing,    Pulmonary    breath sounds clear to auscultation       Cardiovascular hypertension, Pt. on medications  Rhythm:Regular Rate:Normal     Neuro/Psych negative neurological ROS  negative psych ROS   GI/Hepatic negative GI ROS, Neg liver ROS,   Endo/Other    Renal/GU CRFRenal disease  negative genitourinary   Musculoskeletal  (+) Arthritis ,   Abdominal   Peds negative pediatric ROS (+)  Hematology   Anesthesia Other Findings - Prostate Cancer  Reproductive/Obstetrics                            Lab Results  Component Value Date   WBC 7.1 11/23/2015   HGB 12.6* 11/23/2015   HCT 37.5* 11/23/2015   MCV 85.6 11/23/2015   PLT 250 11/23/2015   Lab Results  Component Value Date   CREATININE 1.80* 11/23/2015   BUN 26* 11/23/2015   NA 142 11/23/2015   K 4.2 11/23/2015   CL 107 11/23/2015   CO2 25 11/23/2015   No results found for: INR, PROTIME  EKG: SB with 1st degree AV block   Anesthesia Physical Anesthesia Plan  ASA: III  Anesthesia Plan: General   Post-op Pain Management:    Induction: Intravenous  Airway Management Planned: LMA  Additional Equipment:   Intra-op Plan:   Post-operative Plan: Extubation in OR  Informed Consent:   Plan Discussed with: CRNA  Anesthesia Plan Comments:         Anesthesia Quick Evaluation

## 2015-12-05 ENCOUNTER — Encounter (HOSPITAL_COMMUNITY)
Admission: RE | Disposition: A | Discharge status: Home / Self Care | Payer: Self-pay | Source: Ambulatory Visit | Attending: Urology

## 2015-12-05 ENCOUNTER — Observation Stay (HOSPITAL_COMMUNITY)
Admission: RE | Admit: 2015-12-05 | Discharge: 2015-12-06 | Disposition: A | Discharge status: Home / Self Care | Payer: Medicare Other | Source: Ambulatory Visit | Attending: Urology | Admitting: Urology

## 2015-12-05 ENCOUNTER — Encounter (HOSPITAL_COMMUNITY): Payer: Self-pay | Admitting: Certified Registered Nurse Anesthetist

## 2015-12-05 ENCOUNTER — Ambulatory Visit (HOSPITAL_COMMUNITY): Payer: Medicare Other | Admitting: Anesthesiology

## 2015-12-05 DIAGNOSIS — Z79899 Other long term (current) drug therapy: Secondary | ICD-10-CM | POA: Insufficient documentation

## 2015-12-05 DIAGNOSIS — C61 Malignant neoplasm of prostate: Secondary | ICD-10-CM | POA: Insufficient documentation

## 2015-12-05 DIAGNOSIS — M1990 Unspecified osteoarthritis, unspecified site: Secondary | ICD-10-CM | POA: Insufficient documentation

## 2015-12-05 DIAGNOSIS — N184 Chronic kidney disease, stage 4 (severe): Secondary | ICD-10-CM | POA: Diagnosis not present

## 2015-12-05 DIAGNOSIS — N138 Other obstructive and reflux uropathy: Secondary | ICD-10-CM | POA: Diagnosis present

## 2015-12-05 DIAGNOSIS — I129 Hypertensive chronic kidney disease with stage 1 through stage 4 chronic kidney disease, or unspecified chronic kidney disease: Secondary | ICD-10-CM | POA: Diagnosis not present

## 2015-12-05 DIAGNOSIS — N401 Enlarged prostate with lower urinary tract symptoms: Secondary | ICD-10-CM | POA: Diagnosis not present

## 2015-12-05 DIAGNOSIS — N400 Enlarged prostate without lower urinary tract symptoms: Secondary | ICD-10-CM | POA: Diagnosis not present

## 2015-12-05 DIAGNOSIS — I10 Essential (primary) hypertension: Secondary | ICD-10-CM | POA: Diagnosis not present

## 2015-12-05 DIAGNOSIS — R338 Other retention of urine: Secondary | ICD-10-CM | POA: Diagnosis not present

## 2015-12-05 HISTORY — PX: TRANSURETHRAL RESECTION OF PROSTATE: SHX73

## 2015-12-05 SURGERY — TURP (TRANSURETHRAL RESECTION OF PROSTATE)
Anesthesia: General

## 2015-12-05 MED ORDER — ACETAMINOPHEN 325 MG PO TABS: 650.0000 mg | ORAL_TABLET | ORAL | Status: DC | PRN

## 2015-12-05 MED ORDER — LACTATED RINGERS IV SOLN
INTRAVENOUS | Status: DC | PRN
  Administered 2015-12-05 (×2): via INTRAVENOUS

## 2015-12-05 MED ORDER — ZOLPIDEM TARTRATE 5 MG PO TABS: 5.0000 mg | ORAL_TABLET | Freq: Every evening | ORAL | Status: DC | PRN

## 2015-12-05 MED ORDER — BACITRACIN-NEOMYCIN-POLYMYXIN 400-5-5000 EX OINT: 1.0000 "application " | TOPICAL_OINTMENT | Freq: Three times a day (TID) | CUTANEOUS | Status: DC | PRN

## 2015-12-05 MED ORDER — FENTANYL CITRATE (PF) 100 MCG/2ML IJ SOLN
INTRAMUSCULAR | Status: DC | PRN
  Administered 2015-12-05 (×2): 50 ug via INTRAVENOUS

## 2015-12-05 MED ORDER — CIPROFLOXACIN IN D5W 400 MG/200ML IV SOLN
INTRAVENOUS | Status: AC
  Filled 2015-12-05: qty 200

## 2015-12-05 MED ORDER — CIPROFLOXACIN IN D5W 400 MG/200ML IV SOLN
400.0000 mg | Freq: Once | INTRAVENOUS | Status: AC
  Administered 2015-12-05: 400 mg via INTRAVENOUS
  Filled 2015-12-05: qty 200

## 2015-12-05 MED ORDER — CEFAZOLIN SODIUM-DEXTROSE 2-3 GM-% IV SOLR
2.0000 g | INTRAVENOUS | Status: AC
  Administered 2015-12-05: 2 g via INTRAVENOUS

## 2015-12-05 MED ORDER — FENTANYL CITRATE (PF) 100 MCG/2ML IJ SOLN
INTRAMUSCULAR | Status: AC
  Filled 2015-12-05: qty 2

## 2015-12-05 MED ORDER — ONDANSETRON HCL 4 MG/2ML IJ SOLN
INTRAMUSCULAR | Status: DC | PRN
  Administered 2015-12-05: 4 mg via INTRAVENOUS

## 2015-12-05 MED ORDER — LACTATED RINGERS IV SOLN: INTRAVENOUS | Status: DC

## 2015-12-05 MED ORDER — ONDANSETRON HCL 4 MG/2ML IJ SOLN: 4.0000 mg | INTRAMUSCULAR | Status: DC | PRN

## 2015-12-05 MED ORDER — AMLODIPINE BESYLATE 5 MG PO TABS
5.0000 mg | ORAL_TABLET | Freq: Every day | ORAL | Status: DC
  Administered 2015-12-05 – 2015-12-06 (×2): 5 mg via ORAL
  Filled 2015-12-05 (×2): qty 1

## 2015-12-05 MED ORDER — PROPOFOL 10 MG/ML IV BOLUS
INTRAVENOUS | Status: AC
  Filled 2015-12-05: qty 40

## 2015-12-05 MED ORDER — DIPHENHYDRAMINE HCL 50 MG/ML IJ SOLN: 12.5000 mg | Freq: Four times a day (QID) | INTRAMUSCULAR | Status: DC | PRN

## 2015-12-05 MED ORDER — PROPOFOL 10 MG/ML IV BOLUS
INTRAVENOUS | Status: DC | PRN
  Administered 2015-12-05: 50 mg via INTRAVENOUS
  Administered 2015-12-05: 80 mg via INTRAVENOUS
  Administered 2015-12-05: 50 mg via INTRAVENOUS
  Administered 2015-12-05: 120 mg via INTRAVENOUS

## 2015-12-05 MED ORDER — NITROFURANTOIN MONOHYD MACRO 100 MG PO CAPS: 100.0000 mg | ORAL_CAPSULE | Freq: Every day | ORAL | Status: AC

## 2015-12-05 MED ORDER — LIDOCAINE HCL (CARDIAC) 20 MG/ML IV SOLN
INTRAVENOUS | Status: DC | PRN
  Administered 2015-12-05: 50 mg via INTRAVENOUS

## 2015-12-05 MED ORDER — SUCCINYLCHOLINE CHLORIDE 20 MG/ML IJ SOLN
INTRAMUSCULAR | Status: DC | PRN
  Administered 2015-12-05: 100 mg via INTRAVENOUS

## 2015-12-05 MED ORDER — HYDROCODONE-ACETAMINOPHEN 5-325 MG PO TABS: 1.0000 | ORAL_TABLET | ORAL | Status: DC | PRN

## 2015-12-05 MED ORDER — CEFAZOLIN SODIUM-DEXTROSE 2-3 GM-% IV SOLR
INTRAVENOUS | Status: AC
  Filled 2015-12-05: qty 50

## 2015-12-05 MED ORDER — SULFAMETHOXAZOLE-TRIMETHOPRIM 800-160 MG PO TABS
1.0000 | ORAL_TABLET | Freq: Two times a day (BID) | ORAL | Status: DC
  Administered 2015-12-05 – 2015-12-06 (×3): 1 via ORAL
  Filled 2015-12-05 (×3): qty 1

## 2015-12-05 MED ORDER — FENTANYL CITRATE (PF) 100 MCG/2ML IJ SOLN: 25.0000 ug | INTRAMUSCULAR | Status: DC | PRN

## 2015-12-05 MED ORDER — DIPHENHYDRAMINE HCL 12.5 MG/5ML PO ELIX: 12.5000 mg | ORAL_SOLUTION | Freq: Four times a day (QID) | ORAL | Status: DC | PRN

## 2015-12-05 MED ORDER — EPHEDRINE SULFATE 50 MG/ML IJ SOLN
INTRAMUSCULAR | Status: DC | PRN
  Administered 2015-12-05 (×2): 10 mg via INTRAVENOUS

## 2015-12-05 MED ORDER — SODIUM CHLORIDE 0.9 % IR SOLN
Status: DC | PRN
  Administered 2015-12-05: 18000 mL via INTRAVESICAL

## 2015-12-05 MED ORDER — GLYCOPYRROLATE 0.2 MG/ML IJ SOLN
INTRAMUSCULAR | Status: DC | PRN
  Administered 2015-12-05: 0.2 mg via INTRAVENOUS

## 2015-12-05 MED ORDER — DOCUSATE SODIUM 100 MG PO CAPS
100.0000 mg | ORAL_CAPSULE | Freq: Two times a day (BID) | ORAL | Status: DC
  Administered 2015-12-05 – 2015-12-06 (×3): 100 mg via ORAL
  Filled 2015-12-05 (×3): qty 1

## 2015-12-05 SURGICAL SUPPLY — 16 items
BAG URINE DRAINAGE (UROLOGICAL SUPPLIES) ×3 IMPLANT
BAG URO CATCHER STRL LF (MISCELLANEOUS) ×3 IMPLANT
CATH FOLEY 2WAY 5CC 20FR (CATHETERS) ×3 IMPLANT
ELECT REM PT RETURN 9FT ADLT (ELECTROSURGICAL)
ELECTRODE REM PT RTRN 9FT ADLT (ELECTROSURGICAL) IMPLANT
EVACUATOR MICROVAS BLADDER (UROLOGICAL SUPPLIES) IMPLANT
GLOVE BIOGEL M STRL SZ7.5 (GLOVE) ×3 IMPLANT
GOWN STRL REUS W/TWL XL LVL3 (GOWN DISPOSABLE) ×3 IMPLANT
HOLDER FOLEY CATH W/STRAP (MISCELLANEOUS) ×3 IMPLANT
LOOP CUT BIPOLAR 24F LRG (ELECTROSURGICAL) ×3 IMPLANT
MANIFOLD NEPTUNE II (INSTRUMENTS) ×3 IMPLANT
PACK CYSTO (CUSTOM PROCEDURE TRAY) ×3 IMPLANT
SYR 30ML LL (SYRINGE) ×3 IMPLANT
SYRINGE IRR TOOMEY STRL 70CC (SYRINGE) ×3 IMPLANT
TUBING CONNECTING 10 (TUBING) ×2 IMPLANT
TUBING CONNECTING 10' (TUBING) ×1

## 2015-12-05 NOTE — Anesthesia Postprocedure Evaluation (Signed)
Anesthesia Post Note  Patient: Austin Nicholson  Procedure(s) Performed: Procedure(s) (LRB): TRANSURETHRAL RESECTION OF THE PROSTATE (TURP) RESIDUAL (N/A)  Patient location during evaluation: PACU Anesthesia Type: General Level of consciousness: awake and alert Pain management: pain level controlled Vital Signs Assessment: post-procedure vital signs reviewed and stable Respiratory status: spontaneous breathing, nonlabored ventilation, respiratory function stable and patient connected to nasal cannula oxygen Cardiovascular status: blood pressure returned to baseline and stable Postop Assessment: no signs of nausea or vomiting Anesthetic complications: no    Last Vitals:  Filed Vitals:   12/05/15 1109 12/05/15 1119  BP: 140/72 154/71  Pulse: 88 86  Temp: 36.4 C 36.3 C  Resp: 18 18    Last Pain:  Filed Vitals:   12/05/15 1122  PainSc: 0-No pain                 Effie Berkshire

## 2015-12-05 NOTE — Op Note (Addendum)
Preoperative diagnosis: BPH with bladder outlet obstruction, incomplete bladder emptying, prostate cancer Postoperative diagnosis: Same  Procedure: TURP residual  Surgeon: Junious Silk  Anesthesia: Gen.  Indication for procedure: 81 year old with high-risk prostate cancer, BPH and incomplete bladder emptying who underwent a greenlight photo vaporization of the prostate. On recent cystoscopy there was some necrotic tissue along the prostatic urethra and some residual apical tissue as this typically seen with endoscopic procedures. The patient was voiding with a slower stream and not emptying completely. We discussed a second look TURP residual prior to beginning radiation therapy and the patient elects to proceed.  Findings: On exam under anesthesia the penis was normal without mass or lesion. The testicles were descended bilaterally and palpably normal. On digital rectal exam the prostate was smaller and without hard areas or nodules. No obvious extracapsular extension as all landmarks were preserved.  On cystoscopy the urethra appeared normal, the prostatic urethra was obstructed by some residual mid to apical tissue with some necrotic tissue in the mid prostatic urethra bilaterally. The bladder neck/proximal was widely patent and not resected. The bladder was large capacity but was without stone or foreign body. The trigone and ureteral orifices were normal pre-and post resection. There was clear efflux. The mucosa appeared normal. There was significant trabeculation.  Description of procedure: After consent was obtained patient brought to the operating room. After adequate anesthesia he is placed in lithotomy position and prepped and draped in the usual sterile fashion. A timeout was performed to confirm the patient and procedure. The cystoscope was passed per urethra and the prostatic urethra and the bladder examined. The scope was removed and the resectoscope sheath was passed with the visual  obturator. The resectoscope handle and Loop were then passed and I began on the left side and resected from anterior to posterior mid prostate down to the veru/apex. This was repeated on the right side. Some residual anterior tissue was resected. There was still some flaps of apical tissue but these were thin and I thought they may regress has healed and I left this in place. There was an excellent channel. I don't believe he'll be obstructing. Hemostasis was excellent at low-pressure. All the chips had been evacuated. The bladder was refilled and the scope removed. A 20 French coud catheter was advanced and left gravity drainage. 18 mL were placed in the balloon was seated at the bladder neck. Drainage was clear. The patient was awakened and taken to recovery room in stable condition.  Complications: None  Blood loss: Minimal  Specimens: TURP chips  Drains: 20 French coud catheter  Disposition: Patient stable to PACU

## 2015-12-05 NOTE — Anesthesia Procedure Notes (Signed)
Procedure Name: Intubation Performed by: Gean Maidens Pre-anesthesia Checklist: Patient identified, Emergency Drugs available, Suction available and Patient being monitored Patient Re-evaluated:Patient Re-evaluated prior to inductionOxygen Delivery Method: Circle system utilized Preoxygenation: Pre-oxygenation with 100% oxygen Intubation Type: IV induction Ventilation: Two handed mask ventilation required Laryngoscope Size: Glidescope Grade View: Grade I Tube type: Oral Tube size: 7.5 mm Number of attempts: 2 Airway Equipment and Method: Stylet Placement Confirmation: ETT inserted through vocal cords under direct vision,  positive ETCO2,  CO2 detector and breath sounds checked- equal and bilateral Secured at: 23 cm Tube secured with: Tape Dental Injury: Teeth and Oropharynx as per pre-operative assessment

## 2015-12-05 NOTE — H&P (Signed)
History of Present Illness            F/u - PCP is Dr. Adrian Blackwater. [facility]     1) Prostate cancer - September 2015 - high risk disease, PSA 300, T2-T3, prostate 139 grams, PSA 300, Gleason 4+5 = 9 in all cores.   Last staging: Oct 2015 CT scan negative. Bone scan negative.   Current treatment: Androgen deprivation started Oct 2015  PSA hx:  Sept 2015 PSA 300  Oct 2015 [facility]  Nov 2015 PSA 47   Dec 2015 Lupron x 3 mo  Jan 2016 PSA 15  -Feb 2016 - : "It is the consensus of the multidisciplinary group that he continue with androgen deprivation therapy with plans to proceed with pelvic radiation. However, he still has urinary retention which needs to be addressed. He has been on alpha-blocker therapy and androgen deprivation therapy and is apparently scheduled for a voiding trial in January with Dr. Junious Silk. If he is unable to pass a voiding trial, he may be best served with a TURP prior to radiation therapy".   -Apr 2016 PSA 6.97  -Jun 2016 Lupron - 3 months given  -Jul 2016 PSA 3.4, normal DRE/EUA   -Oct 2016 PSA 2.2; Lupron 22.5 x 3 mo given -- due Jan 2016       2) BPH (125 g), retention s/p foley - plan initiation of androgen deprivation and tamsulosin.  -Sept 2015 trus prostate = 139 grams  -February 2016-failed voiding trial, catheter changed  -Apr 2016 - catheter fell out. Pt could not void. Went to ED. 1L drained after reinsertion.  -June 2016 - UDS - revealed capacity of 457 mL. Hyposensitive bladder. Minimal instability at capacity. Voluntary contraction generated with a max detrusor pressure of 78 cm of water. Contraction was not well sustained an flow rate was minimal. He only voided 4 mL. EMG was normal.  -Jul 2016 - Greenlight PVP --> passed void trial with f/u PVR 228 ml    3) ARF, bilateral hydronephrosis - Aug 2015 - initial BUN, creatinine 50 and 5 improved to 34 and 3.8 . Diuresed about 16 L in 4 days.    -Oct 2015 bone scan-no hydronephrosis    4) gross hematuria - Mar 2016 - cystoscopy benign. Good greenlight candidate. He failed a voiding trial.            Dec 2016 interval history  Patient returns and continue management of prostate cancer. He is on ADT with a low PSA.     PVR today 260 stable. Patient reports he is voiding with a good stream.     Past Medical History Problems  1. History of acute renal failure (Z87.448) 2. History of basal cell carcinoma [id number])  Surgical History Problems  1. History of Laser Vaporization With Transurethral Resection Of Prostate  Current Meds 1. AmLODIPine Besylate 5 MG Oral Tablet;  Therapy: (Recorded:25Aug2016) to Recorded 2. Calcium TABS;  Therapy: (Recorded:11Nov2015) to Recorded  Allergies Medication  1. No Known Drug Allergies  Family History Problems  1. Family history of dementia (Z81.8) : Mother 2. Family history of pneumonia (Z83.1) : Father 3. Family history of prostate cancer (Z80.42) : Father  Social History Problems  1. Denied: History of Alcohol use 2. Never a smoker 3. Occupation   teaches piano and organ 4. Widowed  Vitals Vital Signs [Data Includes: Last 1 Day]  Recorded: [id number] 01:57PM  Blood Pressure: 176 / 80 Temperature: 98.6 F Heart Rate:  64  Physical Exam Constitutional: Well nourished and well developed . No acute distress.  Neuro/Psych:. Mood and affect are appropriate.    Results/Data Urine [Data Includes: Last 1 Day]   [id number]  COLOR YELLOW   APPEARANCE CLEAR   SPECIFIC GRAVITY 1.015   pH 5.5   GLUCOSE NEGATIVE   BILIRUBIN NEGATIVE   KETONE NEGATIVE   BLOOD TRACE   PROTEIN NEGATIVE   NITRITE NEGATIVE   LEUKOCYTE ESTERASE TRACE   SQUAMOUS EPITHELIAL/HPF 0-5 HPF  WBC 10-20 WBC/HPF  RBC 0-2 RBC/HPF  BACTERIA NONE SEEN HPF  CRYSTALS NONE SEEN HPF  CASTS NONE SEEN LPF  Yeast NONE SEEN HPF   PVR: Ultrasound PVR 260 ml.    Procedure  Procedure:  Cystoscopy   Indication: Lower Urinary Tract Symptoms.  Informed Consent: Risks, benefits, and potential adverse events were discussed and informed consent was obtained from the patient.  Prep: The patient was prepped with betadine.  Antibiotic prophylaxis: Ciprofloxacin.  Procedure Note:  Urethral meatus:. No abnormalities.  Anterior urethra: No abnormalities.  Prostatic urethra: No abnormalities . The lateral prostatic lobes were enlarged. A lot of necrotic tissue in prostatic urethra. Apical lateral lobe hypertrophy with obstruction.  Bladder: Visulization was clear. The ureteral orifices were in the normal anatomic position bilaterally and had clear efflux of urine. A systematic survey of the bladder demonstrated no bladder tumors or stones. The mucosa was smooth without abnormalities. The patient tolerated the procedure well.  Complications: None.    Assessment Assessed  1. BPH (benign prostatic hypertrophy) with urinary obstruction (N40.1,N13.8) 2. Prostate cancer (C61) 3. Other retention of urine (R33.8)  Plan BPH (benign prostatic hypertrophy) with urinary obstruction  1. Follow-up Schedule Surgery Office  Follow-up  Status: Hold For - Appointment   Requested for: [id number] 2. URINE CULTURE; Status:Hold For - Specimen/Data Collection,Appointment; Requested  for:08Dec2016;  Health Maintenance  3. UA With REFLEX; [Do Not Release]; Status:Complete;   [id number] 01:47PM  Discussion/Summary     PCa - continue androgen deprivation. PSA low. Plan TURP residual and we'll refer back to Dr. Tammi Klippel in January.      BPH, retention - on rapaflo. There is a good bit of necrotic tissue in the prostatic urethra which is not uncommon after laser surgery and some apical obstruction. I think he would do much better with radiation if we go in and perform a TURP residual to get out all the necrotic prostate tissue and open up the apex of bit. We did discuss risk of stricture and  incontinence among others, but we would not want to perform this type of procedure after radiation. All questions answered and he elects to proceed.    Signatures Electronically signed by : Festus Aloe, M.D.; Nov 16 2015  2:41PM EST  Add: urine cx grew 50k mixed growth. Pt asymptomatic. Will treat with pre-op abx.

## 2015-12-05 NOTE — Interval H&P Note (Signed)
History and Physical Interval Note:  12/05/2015 8:51 AM  Austin Nicholson  has presented today for surgery, with the diagnosis of prostate cancer and BPH  The various methods of treatment have been discussed with the patient and family. After consideration of risks, benefits and other options for treatment, the patient has consented to  Procedure(s): TRANSURETHRAL RESECTION OF THE PROSTATE (TURP) RESIDUAL (N/A) as a surgical intervention .  The patient's history has been reviewed, patient examined, no change in status, stable for surgery.  I have reviewed the patient's chart and labs. Pt well. No dysuria or fever. No gross hematuria. He has noted a slow stream. We discussed and nature/r/b/a to TURP, the rationale for proceeding prior to XRT for PCa and the rationale for XRT for PCa and why he is at higher risk of failure from definitive radiation (high gleason and PSA). We discussed risks of TURP such as bleeding, incontinence, stricture, infection and failure to reach tx goals among other risks. Questions were answered to the patient's satisfaction.  He lives alone, therefore will bring in for admission. He elects to proceed.    Hydeia Mcatee

## 2015-12-05 NOTE — Discharge Instructions (Signed)
Transurethral Resection of the Prostate, Care After °Refer to this sheet in the next few weeks. These instructions provide you with information on caring for yourself after your procedure. Your caregiver also may give you specific instructions. Your treatment has been planned according to current medical practices, but complications sometimes occur. Call your caregiver if you have any problems or questions after your procedure. °HOME CARE INSTRUCTIONS  °Recovery can take 4-6 weeks. Avoid alcohol, caffeinated drinks, and spicy foods for 2 weeks after your procedure. Drink enough fluids to keep your urine clear or pale yellow. Urinate as soon as you feel the urge to do so. Do not try to hold your urine for long periods of time. °During recovery you may experience pain caused by bladder spasms, which result in a very intense urge to urinate. Take all medicines as directed by your caregiver, including medicines for pain. Try to limit the amount of pain medicines you take because it can cause constipation. If you do become constipated, do not strain to move your bowels. Straining can increase bleeding. Constipation can be minimized by increasing the amount fluids and fiber in your diet. Your caregiver also may prescribe a stool softener. °Do not lift heavy objects (more than 5 lb [2.25 kg]) or perform exercises that cause you to strain for at least 1 month after your procedure. When sitting, you may want to sit in a soft chair or use a cushion. For the first 10 days after your procedure, avoid the following activities: °· Running. °· Strenuous work. °· Long walks. °· Riding in a car for extended periods. °· Sex. °SEEK MEDICAL CARE IF: °· You have difficulty urinating. °· You have blood in your urine that does not go away after you rest or increase your fluid intake. °· You have swelling in your penis or scrotum. °SEEK IMMEDIATE MEDICAL CARE IF:  °· You are suddenly unable to urinate. °· You notice blood clots in your  urine. °· You have chills. °· You have a fever. °· You have pain in your back or lower abdomen. °· You have pain or swelling in your legs. °MAKE SURE YOU:  °· Understand these instructions. °· Will watch your condition. °· Will get help right away if you are not doing well or get worse. °  °This information is not intended to replace advice given to you by your health care provider. Make sure you discuss any questions you have with your health care provider. °  °Document Released: 11/25/2005 Document Revised: 12/16/2014 Document Reviewed: 01/03/2012 °Elsevier Interactive Patient Education ©2016 [employer] ° °

## 2015-12-05 NOTE — Transfer of Care (Signed)
Immediate Anesthesia Transfer of Care Note  Patient: Austin Nicholson  Procedure(s) Performed: Procedure(s): TRANSURETHRAL RESECTION OF THE PROSTATE (TURP) RESIDUAL (N/A)  Patient Location: PACU  Anesthesia Type:General  Level of Consciousness: sedated, patient cooperative and responds to stimulation  Airway & Oxygen Therapy: Patient Spontanous Breathing and Patient connected to face mask oxygen  Post-op Assessment: Report given to RN and Post -op Vital signs reviewed and stable  Post vital signs: Reviewed and stable  Last Vitals:  Filed Vitals:   12/05/15 0732  BP: 135/97  Pulse: 78  Temp: 36.7 C  Resp: 16    Complications: No apparent anesthesia complications

## 2015-12-06 DIAGNOSIS — R338 Other retention of urine: Secondary | ICD-10-CM | POA: Diagnosis not present

## 2015-12-06 DIAGNOSIS — N138 Other obstructive and reflux uropathy: Secondary | ICD-10-CM | POA: Diagnosis not present

## 2015-12-06 DIAGNOSIS — N401 Enlarged prostate with lower urinary tract symptoms: Secondary | ICD-10-CM | POA: Diagnosis not present

## 2015-12-06 DIAGNOSIS — C61 Malignant neoplasm of prostate: Secondary | ICD-10-CM | POA: Diagnosis not present

## 2015-12-06 DIAGNOSIS — I10 Essential (primary) hypertension: Secondary | ICD-10-CM | POA: Diagnosis not present

## 2015-12-06 DIAGNOSIS — M1990 Unspecified osteoarthritis, unspecified site: Secondary | ICD-10-CM | POA: Diagnosis not present

## 2015-12-06 MED ORDER — TAMSULOSIN HCL 0.4 MG PO CAPS: 0.4000 mg | ORAL_CAPSULE | Freq: Every day | ORAL | Status: AC

## 2015-12-06 NOTE — Discharge Summary (Signed)
Physician Discharge Summary  Patient ID: Austin Nicholson MRN: [id number] DOB/AGE: 07/08/1928 70 y.o.  Admit date: 12/05/2015 Discharge date: 12/06/2015  Admission Diagnoses:  Discharge Diagnoses:  Active Problems:   BPH (benign prostatic hypertrophy) with urinary obstruction   Discharged Condition: good  Hospital Course: Patient was admitted to observation following residual TURP.  He has done well and remained stable.  He was discharged home on postop day 1 with the Foley catheter.  I'll have him return in a day or 2 for a voiding trial.  He was sent home with nightly nitrofurantoin and tamsulosin.  Consults: None  Significant Diagnostic Studies: none  Treatments: surgery: TURP residual  Discharge Exam: Blood pressure 94/69, pulse 85, temperature 98.4 F (36.9 C), temperature source Oral, resp. rate 18, height 5\' 6"  (1.676 m), weight 82.555 kg (182 lb), SpO2 95 %. In bed watching TV, ordering breakfast Abdomen is soft and nontender Foley to gravity with light pink urine  Disposition: 01-Home or Self Care  Discharge Instructions    Discharge patient    Complete by:  As directed   To home with leg foley and leg bag            Medication List    TAKE these medications        amLODipine 5 MG tablet  Commonly known as:  NORVASC  Take 1 tablet (5 mg total) by mouth daily.     nitrofurantoin (macrocrystal-monohydrate) 100 MG capsule  Commonly known as:  MACROBID  Take 1 capsule (100 mg total) by mouth at bedtime.     tamsulosin 0.4 MG Caps capsule  Commonly known as:  FLOMAX  Take 1 capsule (0.4 mg total) by mouth daily after supper.           Follow-up Information    Follow up with ESKRIDGE, MATTHEW, MD In 2 weeks.   Specialty:  Urology   Contact information:   [place] [address] [phone]       Signed: Festus Aloe 12/06/2015, 8:18 AM

## 2015-12-06 NOTE — Progress Notes (Signed)
Patient is stable for discharge. Discharge instructions and medications have been reviewed with the patient and all questions answered. Provided foley and leg bag teaching and printed discharge instructions on foley care. Patient verbalized and demonstrated that he was comfortable with caring for the foley catheter at home. Patient provided with a leg bag and a large foley catheter bag for bedtime.   Othella Boyer [facility] 12/06/2015 11:21 AM

## 2015-12-08 DIAGNOSIS — N401 Enlarged prostate with lower urinary tract symptoms: Secondary | ICD-10-CM | POA: Diagnosis not present

## 2015-12-08 DIAGNOSIS — N138 Other obstructive and reflux uropathy: Secondary | ICD-10-CM | POA: Diagnosis not present

## 2015-12-22 DIAGNOSIS — R338 Other retention of urine: Secondary | ICD-10-CM | POA: Diagnosis not present

## 2015-12-22 DIAGNOSIS — Z0000 Encounter for general adult medical examination without abnormal findings: Secondary | ICD-10-CM | POA: Diagnosis not present

## 2015-12-22 DIAGNOSIS — C61 Malignant neoplasm of prostate: Secondary | ICD-10-CM | POA: Diagnosis not present

## 2016-01-08 ENCOUNTER — Encounter: Payer: Self-pay | Admitting: Medical Oncology

## 2016-01-08 ENCOUNTER — Ambulatory Visit
Admission: RE | Admit: 2016-01-08 | Discharge: 2016-01-08 | Disposition: A | Discharge status: Home / Self Care | Payer: Medicare Other | Source: Ambulatory Visit | Attending: Radiation Oncology | Admitting: Radiation Oncology

## 2016-01-08 ENCOUNTER — Encounter: Payer: Self-pay | Admitting: Radiation Oncology

## 2016-01-08 VITALS — BP 170/84 | HR 62 | Resp 16 | Ht 66.0 in | Wt 182.5 lb

## 2016-01-08 DIAGNOSIS — Z510 Encounter for antineoplastic radiation therapy: Secondary | ICD-10-CM | POA: Insufficient documentation

## 2016-01-08 DIAGNOSIS — C61 Malignant neoplasm of prostate: Secondary | ICD-10-CM | POA: Diagnosis not present

## 2016-01-08 NOTE — Progress Notes (Signed)
GU Location of Tumor / Histology: prostatic adenocarcinoma  If Prostate Cancer, Gleason Score is (4 + 5) and PSA is (300 on 08/17/2014). PSA on 12/22/2015 is 1.370.    Biopsies of prostate (if applicable) revealed:    Past/Anticipated interventions by urology, if any: prostate biopsy, initiation of androgen deprivation and tamsulosin in September and firmagon in October; referral to radiation oncology. Lupron 45 mg done 12/22/2015.  Past/Anticipated interventions by medical oncology, if any: no  Weight changes, if any: no  Bowel/Bladder complaints, if any: no longer wearing catheter, reports nocturia plus 5, denies dysuria or hematuria, occasional difficulty emptying, denies incontinence or leakage  Nausea/Vomiting, if any: no  Pain issues, if any: denies  SAFETY ISSUES:  Prior radiation? denies  Pacemaker/ICD? denies  Possible current pregnancy? no  Is the patient on methotrexate? no  Current Complaints / other details: 82 year old male. Widowed. Teaches piano and organ. Father had prostate cancer. 139 cc prostate volume.  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 Also, his two grown sons are teachers and live out of town. Patient widowed. Patient does not have a car. Friend brought patient for appointment today. Patient denies that he has ever had a colonoscopy and does not perform routine testicular self exams. Patient reports fatigue, pain and weight changes. Patient wears glasses. IPSS 8. Patient seems pleasantly confused.

## 2016-01-08 NOTE — Progress Notes (Signed)
Oncology Nurse Navigator Documentation  Oncology Nurse Navigator Flowsheets 10/11/2015 01/08/2016  Navigator Location - [facility]  Navigator Encounter Type Other -  Abnormal Finding Date - 08/02/2014  Confirmed Diagnosis Date - 08/17/2014  Treatment Initiated Date - 09/22/2014  Patient Visit Type Follow-up RadOnc;Follow-up- I met Austin Nicholson in the [facility] 11/29/2014.He states he remembers meeting me in the clinic. I gave him my business card and discussed my role as navigator. He is alone today. He has two sons that live out of town but he does have a friend [name] that does help him with his medical care.  He started ADT in October 2015 and in January had a TURP. He is here today to discuss radiation therapy with Dr. Tammi Klippel.   Treatment Phase Other -  Barriers/Navigation Needs - Education  Education - Actor Options;Preparing for Upcoming Surgery/ Treatment-   Interventions - Education Method-  Education Method - Teach-back;Verbal  Acuit - Level 2  Acuity Level 2 - Initial guidance, education and coordination as needed;Educational needs;Ongoing guidance and education throughout treatment as needed  Time Spent with Patient 15 45

## 2016-01-08 NOTE — Progress Notes (Signed)
Radiation Oncology         (336) [phone] ________________________________  Name: Austin Nicholson MRN: [id number]  Date: 01/08/2016  DOB: 1928/07/22  Follow-Up Visit Note  CC: Minerva Ends, MD  Raynelle Bring, MD   Diagnosis:   Prostate cancer - September 2015 - high risk disease, T2-T3, Gleason 4+5 = 9 in all cores.    ICD-9-CM ICD-10-CM   1. Prostate cancer ([place]) [place]     Narrative:  The patient returns today for routine follow-up. The patient is following Dr.Eskridge for urology. Since October 2015, the patient has been on androgen depravation therapy. This patient was originally seen by me for prostate clinic on 11/29/14. From clinic, it was determined that he continue ADT with plans to proceed with pelvic radiation, however he had urinary retention issues which needed to be addressed. The patient had recently presented with BPH and incomplete bladder emptying to Dr.Eskridge. He underwent a GreenLight photo vaporization of the prostate in July 2016. After laser surgery, it was noted that a good amount of necrotic tissue was present with some apical obstruction. A residual TURP was performed to remove necrotic tissue and open up the apex. Most recent PSA level at 1.37.     On today's visit, the patient states he is able to void without the need for a catheter. Lupron 45 mg done 12/22/15. Reports nocturia +5. Denies dysuria or hematuria. Occasional difficulty emptying, Denies incontinence or leakage.   ALLERGIES:  has No Known Allergies.  Meds: Current Outpatient Prescriptions  Medication Sig Dispense Refill  . Calcium Carbonate-Vit D-Min (CALCIUM 1200 PO) Take by mouth.    Marland Kitchen amLODipine (NORVASC) 5 MG tablet Take 1 tablet (5 mg total) by mouth daily. 30 tablet 1  . nitrofurantoin, macrocrystal-monohydrate, (MACROBID) 100 MG capsule Take 1 capsule (100 mg total) by mouth at bedtime. 5 capsule 0  . tamsulosin (FLOMAX) 0.4 MG CAPS capsule Take 1 capsule (0.4 mg total) by mouth  daily after supper. 45 capsule 0   No current facility-administered medications for this encounter.    Physical Findings: The patient is in no acute distress. Patient is alert and oriented.  height is 5\' 6"  (1.676 m) and weight is 182 lb 8 oz (82.781 kg). His blood pressure is 170/84 and his pulse is 62. His respiration is 16 and oxygen saturation is 98%. .  No significant changes.  Lab Findings: Lab Results  Component Value Date   WBC 7.1 11/23/2015   HGB 12.6* 11/23/2015   HCT 37.5* 11/23/2015   PLT 250 11/23/2015    Lab Results  Component Value Date   NA 142 11/23/2015   K 4.2 11/23/2015   CO2 25 11/23/2015   GLUCOSE 94 11/23/2015   BUN 26* 11/23/2015   CREATININE 1.80* 11/23/2015   CREATININE 1.78* 06/20/2015   BILITOT 0.4 11/14/2014   ALKPHOS 59 11/14/2014   AST 12 11/14/2014   ALT <8 11/14/2014   PROT 7.2 11/14/2014   ALBUMIN 3.7 11/14/2014   CALCIUM 9.2 11/23/2015   ANIONGAP 10 11/23/2015    Radiographic Findings: No results found.  Impression: Locally advanced high risk prostate cancer without evidence of overt metastatic disease. He may benefit from localized radiation to the prostate with ADT.   Plan:  Today I reviewed the findings and workup thus far.  We discussed the natural history of prostate cancer.  We reviewed the the implications of T-stage, Gleason's Score, and PSA on decision-making and outcomes in prostate cancer.  We discussed radiation  treatment in the management of prostate cancer with regard to the logistics and delivery of external beam radiation treatment as well as the logistics and delivery of prostate brachytherapy.  We compared and contrasted each of these approaches and also compared these against prostatectomy.  The patient expressed interest in external beam radiotherapy.  I filled out a patient counseling form for him with relevant treatment diagrams and we retained a copy for our records.   The patient would like to proceed with  prostate IMRT.  I will share my findings with Dr. Alinda Money and Dr.Eskridge to move forward with scheduling placement of three gold fiducial markers into the prostate to proceed with IMRT in the near future.     I enjoyed meeting with him today, and will look forward to participating in the care of this very nice gentleman.  I will refer him back to Dr.Eskridge for placement of the gold markers, after we will proceed with simulation planning and radiation treatment.  __________________________________  Sheral Apley. Tammi Klippel, M.D.   This document serves as a record of services personally performed by [name], MD. It was created on his behalf by Derek Mound, a trained medical scribe. The creation of this record is based on the scribe's personal observations and the provider's statements to them. This document has been checked and approved by the attending provider.

## 2016-01-08 NOTE — Progress Notes (Signed)
See progress note under physician encounter. 

## 2016-01-24 ENCOUNTER — Telehealth: Payer: Self-pay | Admitting: *Deleted

## 2016-01-24 NOTE — Telephone Encounter (Signed)
CALLED PATIENT TO INFORM OF APPT FOR GOLD SEED PLACEMENT @ 1 PM @ [facility] (DR. ESKRIDGE'S OFFICE), AND HIS SIM ON 03-08-16 @ 10 AM @ DR. MANNING'S OFFICE, SPOKE WITH PATIENT AND HE IS AWARE OF THESE APPTS.

## 2016-02-26 DIAGNOSIS — C61 Malignant neoplasm of prostate: Secondary | ICD-10-CM | POA: Diagnosis not present

## 2016-03-07 ENCOUNTER — Encounter: Payer: Self-pay | Admitting: Medical Oncology

## 2016-03-07 ENCOUNTER — Ambulatory Visit
Admission: RE | Admit: 2016-03-07 | Discharge: 2016-03-07 | Disposition: A | Discharge status: Home / Self Care | Payer: Medicare Other | Source: Ambulatory Visit | Attending: Radiation Oncology | Admitting: Radiation Oncology

## 2016-03-07 DIAGNOSIS — C61 Malignant neoplasm of prostate: Secondary | ICD-10-CM | POA: Diagnosis not present

## 2016-03-07 DIAGNOSIS — Z510 Encounter for antineoplastic radiation therapy: Secondary | ICD-10-CM | POA: Diagnosis not present

## 2016-03-07 NOTE — Progress Notes (Signed)
  Radiation Oncology         [phone] ________________________________  Name: KENDAHL DENINO MRN: [id number]  Date: 03/07/2016  DOB: 1928-09-29  SIMULATION AND TREATMENT PLANNING NOTE    ICD-9-CM ICD-10-CM   1. Prostate cancer ([place]) 185 C61     DIAGNOSIS: 82 y.o. gentleman with stage T2-3 adenocarcinoma of the prostate with a Gleason's score of 4+5 and a PSA of 299  NARRATIVE:  The patient was brought to the [facility].  Identity was confirmed.  All relevant records and images related to the planned course of therapy were reviewed.  The patient freely provided informed written consent to proceed with treatment after reviewing the details related to the planned course of therapy. The consent form was witnessed and verified by the simulation staff.  Then, the patient was set-up in a stable reproducible supine position for radiation therapy.  A vacuum lock pillow device was custom fabricated to position his legs in a reproducible immobilized position.  Then, I performed a urethrogram under sterile conditions to identify the prostatic apex.  CT images were obtained.  Surface markings were placed.  The CT images were loaded into the planning software.  Then the prostate target and avoidance structures including the rectum, bladder, bowel and hips were contoured.  Treatment planning then occurred.  The radiation prescription was entered and confirmed.  A total of one complex treatment devices were fabricated. I have requested : Intensity Modulated Radiotherapy (IMRT) is medically necessary for this case for the following reason:  Rectal sparing.Marland Kitchen  PLAN:  The patient will receive 75 Gy in 40 fractions.  ________________________________  Sheral Apley Tammi Klippel, M.D.

## 2016-03-07 NOTE — Progress Notes (Signed)
Oncology Nurse Navigator Documentation  Oncology Nurse Navigator Flowsheets 10/11/2015 01/08/2016 03/07/2016  Navigator Location - [facility] -  Navigator Encounter Type Other - Treatment  Abnormal Finding Date - 08/02/2014 -  Confirmed Diagnosis Date - 08/17/2014 -  Treatment Initiated Date - 09/22/2014 -  Patient Visit Type Follow-up RadOnc;Follow-up RadOnc  Treatment Phase Other - CT SIM  Barriers/Navigation Needs - Education Education  Education - Understanding Cancer/ Treatment Options;Preparing for Upcoming Surgery/ Treatment Preparing for Upcoming Surgery/ Treatment- Austin Nicholson here for CT simulation. I discussed the process with him and answered his questions. He completed the simulation without any complications. We discussed the importance of having a full comfortable bladder and how this is trial and error in the beginning. He voiced understanding. I asked him to call me with any questions or concerns.   Interventions - Education Method Education Method  Education Method - Teach-back;Verbal Teach-back;Verbal  Support Groups/Services - - Friends and Family  Acuity - Level 2 Level 2  Acuity Level 2 - Initial guidance, education and coordination as needed;Educational needs;Ongoing guidance and education throughout treatment as needed Initial guidance, education and coordination as needed;Educational needs  Time Spent with Patient [id number]

## 2016-03-08 ENCOUNTER — Ambulatory Visit: Payer: Medicare Other | Admitting: Radiation Oncology

## 2016-03-12 DIAGNOSIS — C61 Malignant neoplasm of prostate: Secondary | ICD-10-CM | POA: Diagnosis not present

## 2016-03-12 DIAGNOSIS — Z510 Encounter for antineoplastic radiation therapy: Secondary | ICD-10-CM | POA: Diagnosis not present

## 2016-03-13 DIAGNOSIS — Z510 Encounter for antineoplastic radiation therapy: Secondary | ICD-10-CM | POA: Diagnosis not present

## 2016-03-13 DIAGNOSIS — C61 Malignant neoplasm of prostate: Secondary | ICD-10-CM | POA: Diagnosis not present

## 2016-03-15 DIAGNOSIS — C61 Malignant neoplasm of prostate: Secondary | ICD-10-CM | POA: Diagnosis not present

## 2016-03-15 DIAGNOSIS — Z510 Encounter for antineoplastic radiation therapy: Secondary | ICD-10-CM | POA: Diagnosis not present

## 2016-03-18 ENCOUNTER — Encounter: Payer: Self-pay | Admitting: Medical Oncology

## 2016-03-18 ENCOUNTER — Ambulatory Visit
Admission: RE | Admit: 2016-03-18 | Discharge: 2016-03-18 | Disposition: A | Discharge status: Home / Self Care | Payer: Medicare Other | Source: Ambulatory Visit | Attending: Radiation Oncology | Admitting: Radiation Oncology

## 2016-03-18 DIAGNOSIS — Z510 Encounter for antineoplastic radiation therapy: Secondary | ICD-10-CM | POA: Diagnosis not present

## 2016-03-18 DIAGNOSIS — C61 Malignant neoplasm of prostate: Secondary | ICD-10-CM | POA: Diagnosis not present

## 2016-03-19 ENCOUNTER — Ambulatory Visit
Admission: RE | Admit: 2016-03-19 | Discharge: 2016-03-19 | Disposition: A | Discharge status: Home / Self Care | Payer: Medicare Other | Source: Ambulatory Visit | Attending: Radiation Oncology | Admitting: Radiation Oncology

## 2016-03-19 DIAGNOSIS — Z510 Encounter for antineoplastic radiation therapy: Secondary | ICD-10-CM | POA: Diagnosis not present

## 2016-03-19 DIAGNOSIS — C61 Malignant neoplasm of prostate: Secondary | ICD-10-CM | POA: Diagnosis not present

## 2016-03-20 ENCOUNTER — Ambulatory Visit
Admission: RE | Admit: 2016-03-20 | Discharge: 2016-03-20 | Disposition: A | Discharge status: Home / Self Care | Payer: Medicare Other | Source: Ambulatory Visit | Attending: Radiation Oncology | Admitting: Radiation Oncology

## 2016-03-20 DIAGNOSIS — C61 Malignant neoplasm of prostate: Secondary | ICD-10-CM | POA: Diagnosis not present

## 2016-03-20 DIAGNOSIS — Z510 Encounter for antineoplastic radiation therapy: Secondary | ICD-10-CM | POA: Diagnosis not present

## 2016-03-21 ENCOUNTER — Ambulatory Visit
Admission: RE | Admit: 2016-03-21 | Discharge: 2016-03-21 | Disposition: A | Discharge status: Home / Self Care | Payer: Medicare Other | Source: Ambulatory Visit | Attending: Radiation Oncology | Admitting: Radiation Oncology

## 2016-03-21 DIAGNOSIS — Z510 Encounter for antineoplastic radiation therapy: Secondary | ICD-10-CM | POA: Diagnosis not present

## 2016-03-21 DIAGNOSIS — C61 Malignant neoplasm of prostate: Secondary | ICD-10-CM | POA: Diagnosis not present

## 2016-03-22 ENCOUNTER — Ambulatory Visit
Admission: RE | Admit: 2016-03-22 | Discharge: 2016-03-22 | Disposition: A | Discharge status: Home / Self Care | Payer: Medicare Other | Source: Ambulatory Visit | Attending: Radiation Oncology | Admitting: Radiation Oncology

## 2016-03-22 ENCOUNTER — Encounter: Payer: Self-pay | Admitting: Medical Oncology

## 2016-03-22 ENCOUNTER — Encounter: Payer: Self-pay | Admitting: Radiation Oncology

## 2016-03-22 VITALS — BP 135/65 | HR 64 | Resp 16 | Wt 186.1 lb

## 2016-03-22 DIAGNOSIS — C61 Malignant neoplasm of prostate: Secondary | ICD-10-CM | POA: Diagnosis not present

## 2016-03-22 DIAGNOSIS — Z510 Encounter for antineoplastic radiation therapy: Secondary | ICD-10-CM | POA: Diagnosis not present

## 2016-03-22 NOTE — Progress Notes (Addendum)
Weight and vitals stable. Denies pain. Reports nocturia x 2. Denies dysuria or hematuria. Denies incontinence or leakage. Denies diarrhea. Denies fatigue. Oriented patient to staff and routine of the clinic. Provided patient with RADIATION THERAPY AND YOU handbook then, reviewed pertinent information. Educated patient reference potential side effects and management such as fatigue, diarrhea, and urinary/bladder changes. Provided patient with my business card and encouraged him to call with needs. Patient verbalized understanding of all reviewed.   BP 135/65 mmHg  Pulse 64  Resp 16  Wt 186 lb 1.6 oz (84.414 kg)  SpO2 100% Wt Readings from Last 3 Encounters:  03/22/16 186 lb 1.6 oz (84.414 kg)  01/08/16 182 lb 8 oz (82.781 kg)  12/05/15 182 lb (82.555 kg)

## 2016-03-22 NOTE — Progress Notes (Signed)
  Radiation Oncology         [phone]   Name: Austin Nicholson MRN: [id number]   Date: 03/22/2016  DOB: 1928-06-21   Weekly Radiation Therapy Management    ICD-9-CM ICD-10-CM   1. Prostate cancer ([place]) 185 C61     Current Dose: 9 Gy  Planned Dose:  75 Gy  Narrative The patient presents for routine under treatment assessment.  Denies pain, incontinence, leakage, dysuria, hematuria, diarrhea, or fatigue. He notices his stool is more soft. Reports nocturia x 2.  Set-up films were reviewed. The chart was checked.  Physical Findings  weight is 186 lb 1.6 oz (84.414 kg). His blood pressure is 135/65 and his pulse is 64. His respiration is 16 and oxygen saturation is 100%. . Weight essentially stable.  No significant changes.  Impression The patient is tolerating radiation.  Plan Continue treatment as planned.     Sheral Apley Tammi Klippel, M.D.  This document serves as a record of services personally performed by [name], MD. It was created on his behalf by Darcus Austin, a trained medical scribe. The creation of this record is based on the scribe's personal observations and the provider's statements to them. This document has been checked and approved by the attending provider.

## 2016-03-25 ENCOUNTER — Ambulatory Visit
Admission: RE | Admit: 2016-03-25 | Discharge: 2016-03-25 | Disposition: A | Discharge status: Home / Self Care | Payer: Medicare Other | Source: Ambulatory Visit | Attending: Radiation Oncology | Admitting: Radiation Oncology

## 2016-03-25 DIAGNOSIS — Z510 Encounter for antineoplastic radiation therapy: Secondary | ICD-10-CM | POA: Diagnosis not present

## 2016-03-25 DIAGNOSIS — C61 Malignant neoplasm of prostate: Secondary | ICD-10-CM | POA: Diagnosis not present

## 2016-03-26 ENCOUNTER — Ambulatory Visit
Admission: RE | Admit: 2016-03-26 | Discharge: 2016-03-26 | Disposition: A | Discharge status: Home / Self Care | Payer: Medicare Other | Source: Ambulatory Visit | Attending: Radiation Oncology | Admitting: Radiation Oncology

## 2016-03-26 DIAGNOSIS — Z510 Encounter for antineoplastic radiation therapy: Secondary | ICD-10-CM | POA: Diagnosis not present

## 2016-03-26 DIAGNOSIS — C61 Malignant neoplasm of prostate: Secondary | ICD-10-CM | POA: Diagnosis not present

## 2016-03-26 NOTE — Addendum Note (Signed)
Encounter addended by: Heywood Footman, RN on: 03/26/2016 10:26 AM<BR>     Documentation filed: Notes Section

## 2016-03-27 ENCOUNTER — Ambulatory Visit
Admission: RE | Admit: 2016-03-27 | Discharge: 2016-03-27 | Disposition: A | Discharge status: Home / Self Care | Payer: Medicare Other | Source: Ambulatory Visit | Attending: Radiation Oncology | Admitting: Radiation Oncology

## 2016-03-27 DIAGNOSIS — C61 Malignant neoplasm of prostate: Secondary | ICD-10-CM | POA: Diagnosis not present

## 2016-03-27 DIAGNOSIS — Z510 Encounter for antineoplastic radiation therapy: Secondary | ICD-10-CM | POA: Diagnosis not present

## 2016-03-28 ENCOUNTER — Ambulatory Visit
Admission: RE | Admit: 2016-03-28 | Discharge: 2016-03-28 | Disposition: A | Discharge status: Home / Self Care | Payer: Medicare Other | Source: Ambulatory Visit | Attending: Radiation Oncology | Admitting: Radiation Oncology

## 2016-03-28 DIAGNOSIS — C61 Malignant neoplasm of prostate: Secondary | ICD-10-CM | POA: Diagnosis not present

## 2016-03-28 DIAGNOSIS — Z510 Encounter for antineoplastic radiation therapy: Secondary | ICD-10-CM | POA: Diagnosis not present

## 2016-03-29 ENCOUNTER — Ambulatory Visit
Admission: RE | Admit: 2016-03-29 | Discharge: 2016-03-29 | Disposition: A | Discharge status: Home / Self Care | Payer: Medicare Other | Source: Ambulatory Visit | Attending: Radiation Oncology | Admitting: Radiation Oncology

## 2016-03-29 ENCOUNTER — Encounter: Payer: Self-pay | Admitting: Medical Oncology

## 2016-03-29 ENCOUNTER — Encounter: Payer: Self-pay | Admitting: Radiation Oncology

## 2016-03-29 VITALS — BP 142/72 | HR 60 | Resp 16 | Wt 188.0 lb

## 2016-03-29 DIAGNOSIS — C61 Malignant neoplasm of prostate: Secondary | ICD-10-CM

## 2016-03-29 DIAGNOSIS — Z510 Encounter for antineoplastic radiation therapy: Secondary | ICD-10-CM | POA: Diagnosis not present

## 2016-03-29 NOTE — Progress Notes (Signed)
Oncology Nurse Navigator Documentation  Oncology Nurse Navigator Flowsheets 03/18/2016 03/22/2016 03/29/2016  Navigator Location - - -  Navigator Encounter Type Treatment - -  Abnormal Finding Date - - -  Confirmed Diagnosis Date - - -  Treatment Initiated Date - - -  Patient Visit Type RadOnc RadOnc RadOnc  Treatment Phase First Radiation Tx Treatment Treatment- Austin Nicholson states he is doing well with radiation. No complaints at this time.   Barriers/Navigation Needs - No barriers at this time No barriers at this time  Education - - -  Interventions - - -  Education Method - - -  Support Groups/Services - Friends and Family Friends and Family  Acuity - Level 1 -  Acuity Level 1 - Initial guidance, education and coordination as needed -  Acuity Level 2 - - -  Time Spent with Patient 30 15 15

## 2016-03-29 NOTE — Progress Notes (Signed)
Weight and vitals stable. Denies pain. Reports nocturia x 2. Denies dysuria or hematuria. Denies incontinence or leakage. Denies diarrhea. Denies fatigue.   BP 142/72 mmHg  Pulse 60  Resp 16  Wt 188 lb (85.276 kg)  SpO2 100% Wt Readings from Last 3 Encounters:  03/29/16 188 lb (85.276 kg)  03/22/16 186 lb 1.6 oz (84.414 kg)  01/08/16 182 lb 8 oz (82.781 kg)

## 2016-03-29 NOTE — Progress Notes (Signed)
  Radiation Oncology         [phone]   Name: Austin Nicholson MRN: [id number]   Date: 03/29/2016  DOB: 07/28/1928   Weekly Radiation Therapy Management    ICD-9-CM ICD-10-CM   1. Prostate cancer ([place]) 185 C61     Current Dose: 18 Gy  Planned Dose:  75 Gy  Narrative The patient presents for routine under treatment assessment.  Weight and vitals stable. Denies pain. Reports nocturia x 2. Denies dysuria or hematuria. Denies incontinence or leakage. Denies diarrhea. Denies fatigue.  Set-up films were reviewed. The chart was checked.  Physical Findings  weight is 188 lb (85.276 kg). His blood pressure is 142/72 and his pulse is 60. His respiration is 16 and oxygen saturation is 100%. . Weight essentially stable.  No significant changes.  Impression The patient is tolerating radiation.  Plan Continue treatment as planned. Encouraged the patient to continue regular exercise which may help alleviate radiation induced fatigue.     Sheral Apley Tammi Klippel, M.D.  This document serves as a record of services personally performed by [name], MD. It was created on his behalf by Arlyce Harman, a trained medical scribe. The creation of this record is based on the scribe's personal observations and the provider's statements to them. This document has been checked and approved by the attending provider.

## 2016-04-01 ENCOUNTER — Ambulatory Visit
Admission: RE | Admit: 2016-04-01 | Discharge: 2016-04-01 | Disposition: A | Discharge status: Home / Self Care | Payer: Medicare Other | Source: Ambulatory Visit | Attending: Radiation Oncology | Admitting: Radiation Oncology

## 2016-04-01 DIAGNOSIS — Z510 Encounter for antineoplastic radiation therapy: Secondary | ICD-10-CM | POA: Diagnosis not present

## 2016-04-01 DIAGNOSIS — C61 Malignant neoplasm of prostate: Secondary | ICD-10-CM | POA: Diagnosis not present

## 2016-04-02 ENCOUNTER — Ambulatory Visit
Admission: RE | Admit: 2016-04-02 | Discharge: 2016-04-02 | Disposition: A | Discharge status: Home / Self Care | Payer: Medicare Other | Source: Ambulatory Visit | Attending: Radiation Oncology | Admitting: Radiation Oncology

## 2016-04-02 DIAGNOSIS — C61 Malignant neoplasm of prostate: Secondary | ICD-10-CM | POA: Diagnosis not present

## 2016-04-02 DIAGNOSIS — Z510 Encounter for antineoplastic radiation therapy: Secondary | ICD-10-CM | POA: Diagnosis not present

## 2016-04-03 ENCOUNTER — Encounter: Payer: Medicare Other | Admitting: Nutrition

## 2016-04-03 ENCOUNTER — Ambulatory Visit
Admission: RE | Admit: 2016-04-03 | Discharge: 2016-04-03 | Disposition: A | Discharge status: Home / Self Care | Payer: Medicare Other | Source: Ambulatory Visit | Attending: Radiation Oncology | Admitting: Radiation Oncology

## 2016-04-03 ENCOUNTER — Encounter: Payer: Self-pay | Admitting: Nutrition

## 2016-04-03 DIAGNOSIS — C61 Malignant neoplasm of prostate: Secondary | ICD-10-CM | POA: Diagnosis not present

## 2016-04-03 DIAGNOSIS — Z510 Encounter for antineoplastic radiation therapy: Secondary | ICD-10-CM | POA: Diagnosis not present

## 2016-04-03 NOTE — Progress Notes (Signed)
Patient did not show up for nutrition appointment. 

## 2016-04-04 ENCOUNTER — Encounter: Payer: Self-pay | Admitting: Radiation Oncology

## 2016-04-04 ENCOUNTER — Ambulatory Visit
Admission: RE | Admit: 2016-04-04 | Discharge: 2016-04-04 | Disposition: A | Discharge status: Home / Self Care | Payer: Medicare Other | Source: Ambulatory Visit | Attending: Radiation Oncology | Admitting: Radiation Oncology

## 2016-04-04 ENCOUNTER — Encounter: Payer: Self-pay | Admitting: Medical Oncology

## 2016-04-04 VITALS — BP 153/72 | HR 67 | Resp 16 | Wt 187.1 lb

## 2016-04-04 DIAGNOSIS — Z510 Encounter for antineoplastic radiation therapy: Secondary | ICD-10-CM | POA: Diagnosis not present

## 2016-04-04 DIAGNOSIS — C61 Malignant neoplasm of prostate: Secondary | ICD-10-CM

## 2016-04-04 HISTORY — DX: Unspecified malignant neoplasm of skin, unspecified: C44.90

## 2016-04-04 NOTE — Progress Notes (Signed)
Weight and vitals stable. Denies pain. Reports nocturia x 2. Denies dysuria or hematuria. Denies incontinence or leakage. Denies diarrhea. Denies fatigue.   BP 153/72 mmHg  Pulse 67  Resp 16  Wt 187 lb 1.6 oz (84.868 kg)  SpO2 100% Wt Readings from Last 3 Encounters:  04/04/16 187 lb 1.6 oz (84.868 kg)  03/29/16 188 lb (85.276 kg)  03/22/16 186 lb 1.6 oz (84.414 kg)

## 2016-04-04 NOTE — Progress Notes (Signed)
  Radiation Oncology         [phone]   Name: Austin Nicholson MRN: [id number]   Date: 04/04/2016  DOB: [birth year]   Weekly Radiation Therapy Management    ICD-9-CM ICD-10-CM   1. Prostate cancer ([place]) 185 C61     Current Dose: 25.2 Gy  Planned Dose:  75 Gy  Narrative The patient presents for routine under treatment assessment.  Weight and vitals stable. Denies pain. Reports nocturia x 2. He had an episode when he went to bed early and had nocturia x 4. Denies dysuria or hematuria. Denies incontinence or leakage. Denies diarrhea. Denies fatigue. He is currently taking Flomax 0.4 mg.   Set-up films were reviewed. The chart was checked.  Physical Findings  weight is 187 lb 1.6 oz (84.868 kg). His blood pressure is 153/72 and his pulse is 67. His respiration is 16 and oxygen saturation is 100%. . Weight essentially stable.  No significant changes.  Impression The patient is tolerating radiation.  Plan Continue treatment as planned.     Sheral Apley Tammi Klippel, M.D.    This document serves as a record of services personally performed by [name], MD. It was created on his behalf by Lendon Collar, a trained medical scribe. The creation of this record is based on the scribe's personal observations and the provider's statements to them. This document has been checked and approved by the attending provider.

## 2016-04-04 NOTE — Progress Notes (Signed)
Oncology Nurse Navigator Documentation  Oncology Nurse Navigator Flowsheets 03/22/2016 03/29/2016 04/04/2016  Navigator Location - - -  Navigator Encounter Type - - Treatment- Austin Nicholson states he is doing well with radiation. He feels energized since starting. He had one night he had nocturia x 4 but usually only x 2. He had requested an appointment with Dory Peru to discuss diet but left yesterday without seeing Barb. I will get him rescheduled with Barb. We discussed diet for the prostate patient. He currently eats a lot of spinach and turnip greens which are very healthy. He does not do a lot of cooking but eats at the [employer] where he can get vegetables. I will have some hand outs for the patient at his visit tomorrow.   Abnormal Finding Date - - -  Confirmed Diagnosis Date - - -  Treatment Initiated Date - - -  Patient Visit Type RadOnc [id number];Follow-up  Treatment Phase Treatment Treatment Treatment  Barriers/Navigation Needs No barriers at this time No barriers at this time Education  Education - - Other  Interventions - - -  Education Method - - -  Support Groups/Services Friends and Family Friends and Family Friends and Family  Acuity Level 1 - -  Acuity Level 1 Initial guidance, education and coordination as needed - -  Acuity Level 2 - - -  Time Spent with Patient 15 15 30

## 2016-04-05 ENCOUNTER — Ambulatory Visit
Admission: RE | Admit: 2016-04-05 | Discharge: 2016-04-05 | Disposition: A | Discharge status: Home / Self Care | Payer: Medicare Other | Source: Ambulatory Visit | Attending: Radiation Oncology | Admitting: Radiation Oncology

## 2016-04-05 ENCOUNTER — Encounter: Payer: Self-pay | Admitting: Medical Oncology

## 2016-04-05 DIAGNOSIS — Z510 Encounter for antineoplastic radiation therapy: Secondary | ICD-10-CM | POA: Diagnosis not present

## 2016-04-05 DIAGNOSIS — C61 Malignant neoplasm of prostate: Secondary | ICD-10-CM | POA: Diagnosis not present

## 2016-04-05 NOTE — Progress Notes (Signed)
Oncology Nurse Navigator Documentation  Oncology Nurse Navigator Flowsheets 03/29/2016 04/04/2016 04/05/2016  Navigator Location - - -  Navigator Encounter Type - Treatment Treatment  Abnormal Finding Date - - -  Confirmed Diagnosis Date - - -  Treatment Initiated Date - - -  Patient Visit Type RadOnc RadOnc;Follow-up -  Treatment Phase Treatment Treatment -  Barriers/Navigation Needs No barriers at this time Education Education  Education - Other Other  Interventions - - Education Method  Education Method - - Written- I gave Mr. Bessler hand outs regarding diet and prostate cancer. We reviewed the type of items he needs to eat. He is going to read thru the information and I will touch base with him next week and if he wants to reschedule his visit with Dory Peru.  Support Groups/Services Friends and Family Friends and Family -  Acuity - - -  Acuity Level 1 - - -  Acuity Level 2 - - -  Time Spent with Patient 15 30 30

## 2016-04-08 ENCOUNTER — Ambulatory Visit
Admission: RE | Admit: 2016-04-08 | Discharge: 2016-04-08 | Disposition: A | Discharge status: Home / Self Care | Payer: Medicare Other | Source: Ambulatory Visit | Attending: Radiation Oncology | Admitting: Radiation Oncology

## 2016-04-08 DIAGNOSIS — Z510 Encounter for antineoplastic radiation therapy: Secondary | ICD-10-CM | POA: Insufficient documentation

## 2016-04-08 DIAGNOSIS — C61 Malignant neoplasm of prostate: Secondary | ICD-10-CM | POA: Diagnosis not present

## 2016-04-09 ENCOUNTER — Ambulatory Visit
Admission: RE | Admit: 2016-04-09 | Discharge: 2016-04-09 | Disposition: A | Discharge status: Home / Self Care | Payer: Medicare Other | Source: Ambulatory Visit | Attending: Radiation Oncology | Admitting: Radiation Oncology

## 2016-04-09 DIAGNOSIS — C61 Malignant neoplasm of prostate: Secondary | ICD-10-CM | POA: Diagnosis not present

## 2016-04-09 DIAGNOSIS — Z510 Encounter for antineoplastic radiation therapy: Secondary | ICD-10-CM | POA: Diagnosis not present

## 2016-04-10 ENCOUNTER — Ambulatory Visit
Admission: RE | Admit: 2016-04-10 | Discharge: 2016-04-10 | Disposition: A | Discharge status: Home / Self Care | Payer: Medicare Other | Source: Ambulatory Visit | Attending: Radiation Oncology | Admitting: Radiation Oncology

## 2016-04-10 DIAGNOSIS — Z510 Encounter for antineoplastic radiation therapy: Secondary | ICD-10-CM | POA: Diagnosis not present

## 2016-04-10 DIAGNOSIS — C61 Malignant neoplasm of prostate: Secondary | ICD-10-CM | POA: Diagnosis not present

## 2016-04-11 ENCOUNTER — Ambulatory Visit
Admission: RE | Admit: 2016-04-11 | Discharge: 2016-04-11 | Disposition: A | Discharge status: Home / Self Care | Payer: Medicare Other | Source: Ambulatory Visit | Attending: Radiation Oncology | Admitting: Radiation Oncology

## 2016-04-11 DIAGNOSIS — C61 Malignant neoplasm of prostate: Secondary | ICD-10-CM | POA: Diagnosis not present

## 2016-04-11 DIAGNOSIS — Z510 Encounter for antineoplastic radiation therapy: Secondary | ICD-10-CM | POA: Diagnosis not present

## 2016-04-12 ENCOUNTER — Ambulatory Visit
Admission: RE | Admit: 2016-04-12 | Discharge: 2016-04-12 | Disposition: A | Discharge status: Home / Self Care | Payer: Medicare Other | Source: Ambulatory Visit | Attending: Radiation Oncology | Admitting: Radiation Oncology

## 2016-04-12 VITALS — BP 160/73 | HR 70 | Temp 98.1°F | Resp 12 | Wt 188.9 lb

## 2016-04-12 DIAGNOSIS — C61 Malignant neoplasm of prostate: Secondary | ICD-10-CM | POA: Diagnosis not present

## 2016-04-12 DIAGNOSIS — Z510 Encounter for antineoplastic radiation therapy: Secondary | ICD-10-CM | POA: Diagnosis not present

## 2016-04-12 NOTE — Progress Notes (Signed)
  Radiation Oncology         [phone]   Name: Austin Nicholson MRN: [id number]   Date: 04/12/2016  DOB: 09-13-1928   Weekly Radiation Therapy Management    ICD-9-CM ICD-10-CM   1. Prostate cancer ([place]) 185 C61     Current Dose: 36 Gy  Planned Dose:  75 Gy  Narrative The patient presents for routine under treatment assessment.  Reports urinary urgency and retention, urine is clear yellow. Patient states they urinate 1-2 times per night. Reports a soft bowel movement everyday/every other day. He has noticed a slight increase in the number on bowel movements a day. Good spirits, no other complaints. He reports he doesn't take any medication. Encouraged to continue taking the prescribed medication. His BP was elevated.   Set-up films were reviewed. The chart was checked.  Physical Findings  weight is 188 lb 14.4 oz (85.684 kg). His oral temperature is 98.1 F (36.7 C). His blood pressure is 160/73 and his pulse is 70. His respiration is 12 and oxygen saturation is 97%. . Weight essentially stable.  No significant changes.  Impression The patient is tolerating radiation.  Plan Continue treatment as planned.     Sheral Apley Tammi Klippel, M.D.    This document serves as a record of services personally performed by [name], MD. It was created on his behalf by Arlyce Harman, a trained medical scribe. The creation of this record is based on the scribe's personal observations and the provider's statements to them. This document has been checked and approved by the attending provider.

## 2016-04-12 NOTE — Progress Notes (Signed)
PAIN: He is currently in no pain.  URINARY: Pt reports urinary urgency and retention, urine is clear yellow. Pt states they urinate 1 - 2 times per night.  BOWEL: Pt reports a soft bowel movement everyday/everyother day. He has noticed a slight increase in the number on bowel movements a day OTHER: Good spirits, no other complaints.  He reports he doesn't take any medication.  Encouraged to continue taking the prescribed medication.  His BP was elevated.   BP 160/73 mmHg  Pulse 70  Temp(Src) 98.1 F (36.7 C) (Oral)  Resp 12  Wt 188 lb 14.4 oz (85.684 kg)  SpO2 97% Wt Readings from Last 3 Encounters:  04/12/16 188 lb 14.4 oz (85.684 kg)  04/04/16 187 lb 1.6 oz (84.868 kg)  03/29/16 188 lb (85.276 kg)

## 2016-04-15 ENCOUNTER — Ambulatory Visit
Admission: RE | Admit: 2016-04-15 | Discharge: 2016-04-15 | Disposition: A | Discharge status: Home / Self Care | Payer: Medicare Other | Source: Ambulatory Visit | Attending: Radiation Oncology | Admitting: Radiation Oncology

## 2016-04-15 DIAGNOSIS — Z510 Encounter for antineoplastic radiation therapy: Secondary | ICD-10-CM | POA: Diagnosis not present

## 2016-04-15 DIAGNOSIS — C61 Malignant neoplasm of prostate: Secondary | ICD-10-CM | POA: Diagnosis not present

## 2016-04-16 ENCOUNTER — Ambulatory Visit
Admission: RE | Admit: 2016-04-16 | Discharge: 2016-04-16 | Disposition: A | Discharge status: Home / Self Care | Payer: Medicare Other | Source: Ambulatory Visit | Attending: Radiation Oncology | Admitting: Radiation Oncology

## 2016-04-16 DIAGNOSIS — C61 Malignant neoplasm of prostate: Secondary | ICD-10-CM | POA: Diagnosis not present

## 2016-04-16 DIAGNOSIS — Z510 Encounter for antineoplastic radiation therapy: Secondary | ICD-10-CM | POA: Diagnosis not present

## 2016-04-17 ENCOUNTER — Encounter: Payer: Self-pay | Admitting: Medical Oncology

## 2016-04-17 ENCOUNTER — Ambulatory Visit
Admission: RE | Admit: 2016-04-17 | Discharge: 2016-04-17 | Disposition: A | Discharge status: Home / Self Care | Payer: Medicare Other | Source: Ambulatory Visit | Attending: Radiation Oncology | Admitting: Radiation Oncology

## 2016-04-17 DIAGNOSIS — Z510 Encounter for antineoplastic radiation therapy: Secondary | ICD-10-CM | POA: Diagnosis not present

## 2016-04-17 DIAGNOSIS — C61 Malignant neoplasm of prostate: Secondary | ICD-10-CM | POA: Diagnosis not present

## 2016-04-17 NOTE — Progress Notes (Signed)
Oncology Nurse Navigator Documentation  Oncology Nurse Navigator Flowsheets 04/04/2016 04/05/2016 04/17/2016  Navigator Location - - -  Navigator Encounter Type Treatment Treatment Treatment- Austin Nicholson states he is doing well without major complaints. He does has nocturia x 2, urgency and increased frequency but not really bothersome. I asked him to call me with any concerns or questions. He voiced understanding.  Abnormal Finding Date - - -  Confirmed Diagnosis Date - - -  Treatment Initiated Date - - -  Patient Visit Type RadOnc;Follow-up - -  Treatment Phase Treatment - -  Barriers/Navigation Needs Education Education No barriers at this time  Education Other Other -  Interventions - Education Method -  Education Method - Written -  Support Groups/Services Friends and Family - -  Acuity - - -  Acuity Level 1 - - -  Acuity Level 2 - - -  Time Spent with Patient [id number]

## 2016-04-18 ENCOUNTER — Encounter: Payer: Self-pay | Admitting: Radiation Oncology

## 2016-04-18 ENCOUNTER — Ambulatory Visit
Admission: RE | Admit: 2016-04-18 | Discharge: 2016-04-18 | Disposition: A | Discharge status: Home / Self Care | Payer: Medicare Other | Source: Ambulatory Visit | Attending: Radiation Oncology | Admitting: Radiation Oncology

## 2016-04-18 VITALS — BP 159/65 | HR 63 | Temp 98.5°F | Resp 16 | Ht 66.0 in | Wt 189.3 lb

## 2016-04-18 DIAGNOSIS — C61 Malignant neoplasm of prostate: Secondary | ICD-10-CM

## 2016-04-18 DIAGNOSIS — Z510 Encounter for antineoplastic radiation therapy: Secondary | ICD-10-CM | POA: Diagnosis not present

## 2016-04-18 NOTE — Progress Notes (Signed)
Austin Nicholson has completed 24 fractions to his pelvis. He denies having pan, dysuria, hematuria and diarrhea.  He reports getting up 2-3 times per night to urinate.  He reports urinary frequency during the day.  He denies having fatigue.  He mentioned that he has not been taking any medication and said he has run out of Flomax.  He can't remember when he ran out and is not sure if he needs to start taking it again.  BP 159/65 mmHg  Pulse 63  Temp(Src) 98.5 F (36.9 C) (Oral)  Resp 16  Ht 5\' 6"  (1.676 m)  Wt 189 lb 4.8 oz (85.866 kg)  BMI 30.57 kg/m2

## 2016-04-18 NOTE — Progress Notes (Signed)
  Radiation Oncology         [phone]   Name: Austin Nicholson MRN: [id number]   Date: 04/18/2016  DOB: January 04, 1928   Weekly Radiation Therapy Management    ICD-9-CM ICD-10-CM   1. Prostate cancer ([place]) 185 C61     Current Dose: 43.2 Gy  Planned Dose:  75 Gy  Narrative The patient presents for routine under treatment assessment.  Austin Nicholson has completed 24 fractions to his pelvis. He denies any pain, dysuria, hematuria, diarrhea, and fatigue. He reports nocturia 2-3x. He also reports urinary frequency during the day. He did mentioned that he has not been taking any medication and stated that he has run out of Flomax. He does not recall when he ran out and is not sure if he needs to start taking it again.  Set-up films were reviewed. The chart was checked.  Physical Findings  height is 5\' 6"  (1.676 m) and weight is 189 lb 4.8 oz (85.866 kg). His oral temperature is 98.5 F (36.9 C). His blood pressure is 159/65 and his pulse is 63. His respiration is 16.  Weight essentially stable. This is a pleasant male in no acute distress. He is alert and oriented.   Impression The patient is tolerating radiation.  Plan Continue treatment as planned. He was advised to let me know if he feels as if he needs the Flomax prescription renewed.     Sheral Apley Tammi Klippel, M.D.  This document serves as a record of services personally performed by Shona Simpson, PA and [name], MD. It was created on their behalf by Jenell Milliner, a trained medical scribe. The creation of this record is based on the scribe's personal observations and the provider's statements to them. This document has been checked and approved by the attending provider.

## 2016-04-19 ENCOUNTER — Ambulatory Visit
Admission: RE | Admit: 2016-04-19 | Discharge: 2016-04-19 | Disposition: A | Discharge status: Home / Self Care | Payer: Medicare Other | Source: Ambulatory Visit | Attending: Radiation Oncology | Admitting: Radiation Oncology

## 2016-04-19 DIAGNOSIS — C61 Malignant neoplasm of prostate: Secondary | ICD-10-CM | POA: Diagnosis not present

## 2016-04-19 DIAGNOSIS — Z510 Encounter for antineoplastic radiation therapy: Secondary | ICD-10-CM | POA: Diagnosis not present

## 2016-04-22 ENCOUNTER — Encounter: Payer: Self-pay | Admitting: Medical Oncology

## 2016-04-22 ENCOUNTER — Ambulatory Visit
Admission: RE | Admit: 2016-04-22 | Discharge: 2016-04-22 | Disposition: A | Discharge status: Home / Self Care | Payer: Medicare Other | Source: Ambulatory Visit | Attending: Radiation Oncology | Admitting: Radiation Oncology

## 2016-04-22 DIAGNOSIS — C61 Malignant neoplasm of prostate: Secondary | ICD-10-CM | POA: Diagnosis not present

## 2016-04-22 DIAGNOSIS — Z510 Encounter for antineoplastic radiation therapy: Secondary | ICD-10-CM | POA: Diagnosis not present

## 2016-04-22 NOTE — Progress Notes (Signed)
Oncology Nurse Navigator Documentation  Oncology Nurse Navigator Flowsheets 04/05/2016 04/17/2016 04/22/2016  Navigator Location - - -  Navigator Encounter Type Treatment Treatment Treatment  Abnormal Finding Date - - -  Confirmed Diagnosis Date - - -  Treatment Initiated Date - - -  Patient Visit Type - - RadOnc  Treatment Phase - - Treatment  Barriers/Navigation Needs Education No barriers at this time Education  Education Other - Other- When Mr. Dils was seen for his under treat visit with Dr. Tammi Klippel he informed [name] that he was out of his medications. When she questioned him he did not seem to understand why he needed to take them. I spoke with Mr. Covault and he states that he has not seen his primary care MD and he was not aware that he needed to continue to take the medications. I discussed the importance of taking his blood pressure medication and that he should get an appointment to see his primary care. He states he does not know the name of his physician. I asked him to bring an empty medication bottle so I can get the name and help him make an appointment. He voiced understanding. I was able to see in his record that he has seen Dr. Boykin Nearing in the past. I will discuss with Mr. Voshell at his next visit.  Interventions Education Method - Education Method  Education Method Written - Teach-back;Verbal  Support Groups/Services - - Friends and Family  Acuity - - -  Acuity Level 1 - - -  Acuity Level 2 - - -  Time Spent with Patient [id number]

## 2016-04-23 ENCOUNTER — Ambulatory Visit
Admission: RE | Admit: 2016-04-23 | Discharge: 2016-04-23 | Disposition: A | Discharge status: Home / Self Care | Payer: Medicare Other | Source: Ambulatory Visit | Attending: Radiation Oncology | Admitting: Radiation Oncology

## 2016-04-23 DIAGNOSIS — Z510 Encounter for antineoplastic radiation therapy: Secondary | ICD-10-CM | POA: Diagnosis not present

## 2016-04-23 DIAGNOSIS — C61 Malignant neoplasm of prostate: Secondary | ICD-10-CM | POA: Diagnosis not present

## 2016-04-24 ENCOUNTER — Ambulatory Visit
Admission: RE | Admit: 2016-04-24 | Discharge: 2016-04-24 | Disposition: A | Discharge status: Home / Self Care | Payer: Medicare Other | Source: Ambulatory Visit | Attending: Radiation Oncology | Admitting: Radiation Oncology

## 2016-04-24 DIAGNOSIS — C61 Malignant neoplasm of prostate: Secondary | ICD-10-CM | POA: Diagnosis not present

## 2016-04-24 DIAGNOSIS — Z510 Encounter for antineoplastic radiation therapy: Secondary | ICD-10-CM | POA: Diagnosis not present

## 2016-04-25 ENCOUNTER — Ambulatory Visit
Admission: RE | Admit: 2016-04-25 | Discharge: 2016-04-25 | Disposition: A | Discharge status: Home / Self Care | Payer: Medicare Other | Source: Ambulatory Visit | Attending: Radiation Oncology | Admitting: Radiation Oncology

## 2016-04-25 DIAGNOSIS — C61 Malignant neoplasm of prostate: Secondary | ICD-10-CM | POA: Diagnosis not present

## 2016-04-25 DIAGNOSIS — Z510 Encounter for antineoplastic radiation therapy: Secondary | ICD-10-CM | POA: Diagnosis not present

## 2016-04-26 ENCOUNTER — Telehealth: Payer: Self-pay | Admitting: Medical Oncology

## 2016-04-26 ENCOUNTER — Telehealth: Payer: Self-pay | Admitting: Family Medicine

## 2016-04-26 ENCOUNTER — Encounter: Payer: Self-pay | Admitting: Radiation Oncology

## 2016-04-26 ENCOUNTER — Ambulatory Visit
Admission: RE | Admit: 2016-04-26 | Discharge: 2016-04-26 | Disposition: A | Discharge status: Home / Self Care | Payer: Medicare Other | Source: Ambulatory Visit | Attending: Radiation Oncology | Admitting: Radiation Oncology

## 2016-04-26 VITALS — BP 151/78 | HR 67 | Resp 16 | Wt 187.5 lb

## 2016-04-26 DIAGNOSIS — C61 Malignant neoplasm of prostate: Secondary | ICD-10-CM

## 2016-04-26 DIAGNOSIS — I10 Essential (primary) hypertension: Secondary | ICD-10-CM

## 2016-04-26 DIAGNOSIS — Z510 Encounter for antineoplastic radiation therapy: Secondary | ICD-10-CM | POA: Diagnosis not present

## 2016-04-26 NOTE — Progress Notes (Signed)
Weight and vitals stable. Cira Rue, RN working with patient to get refill of his routine medications. Denies pain. Denies dysuria or hematuria. Reports nocturia x 2-3. Reports urinary frequency. Reports occasional difficulty emptying his bladder. Describes a strong steady urine stream. Denies leakage or incontinence. Denies diarrhea. Denies mild fatigue.   BP 151/78 mmHg  Pulse 67  Resp 16  Wt 187 lb 8 oz (85.049 kg)  SpO2 100% Wt Readings from Last 3 Encounters:  04/26/16 187 lb 8 oz (85.049 kg)  04/18/16 189 lb 4.8 oz (85.866 kg)  04/12/16 188 lb 14.4 oz (85.684 kg)

## 2016-04-26 NOTE — Telephone Encounter (Signed)
Oncology Nurse Navigator Documentation  Oncology Nurse Navigator Flowsheets 04/22/2016 04/26/2016 04/26/2016  Navigator Location - - -  Navigator Encounter Type Treatment Telephone Telephone  Telephone - Outgoing Call Outgoing Call;Appt Confirmation/Clarification  Abnormal Finding Date - - -  Confirmed Diagnosis Date - - -  Treatment Initiated Date - - -  Patient Visit Type RadOnc - -  Treatment Phase Treatment - -  Barriers/Navigation Needs Education Coordination of Care Coordination of Care- I spoke with Austin Nicholson to inform him of appointment with Dr. Adrian Blackwater on 6/2 arriving at 3:30 pm. He is aware that Dr. Adrian Blackwater is going to send a refill to the [facility] for his Norvasc. I will take this written information to him on Monday at his radiation appointment.  Education Other - -  Interventions Education Method Coordination of Care Coordination of Care  Coordination of Care - Appts Appts  Education Method Teach-back;Verbal - -  Support Groups/Services Friends and Family - -  Acuity - - -  Acuity Level 1 - - -  Acuity Level 2 - - -  Time Spent with Patient [id number]

## 2016-04-26 NOTE — Progress Notes (Signed)
  Radiation Oncology         [phone]   Name: Austin Nicholson MRN: [id number]   Date: 04/26/2016  DOB: 22-Jun-1928   Weekly Radiation Therapy Management    ICD-9-CM ICD-10-CM   1. Prostate cancer ([place]) 185 C61     Current Dose: 54 Gy  Planned Dose:  75 Gy  Narrative The patient presents for routine under treatment assessment.  Weight and vitals stable. Cira Rue, RN working with patient to get refill of his routine medications. Denies pain. Denies dysuria or hematuria. Reports nocturia x 2-3. Reports urinary frequency. Reports occasional difficulty emptying his bladder. Bowel movements 1-2x/day. Describes a strong steady urine stream. Denies leakage or incontinence. Denies diarrhea. Denies mild fatigue. Reports minor swallowing discomfort.  Set-up films were reviewed. The chart was checked.  Physical Findings  weight is 187 lb 8 oz (85.049 kg). His blood pressure is 151/78 and his pulse is 67. His respiration is 16 and oxygen saturation is 100%.  Weight essentially stable. This is a pleasant male in no acute distress. He is alert and oriented.   Impression The patient is tolerating radiation.  Plan Continue treatment as planned.     Sheral Apley Tammi Klippel, M.D.   This document serves as a record of services personally performed by [name], MD. It was created on his behalf by Derek Mound, a trained medical scribe. The creation of this record is based on the scribe's personal observations and the provider's statements to them. This document has been checked and approved by the attending provider.

## 2016-04-26 NOTE — Telephone Encounter (Signed)
error 

## 2016-04-26 NOTE — Telephone Encounter (Signed)
Pt. Called requesting a refill on amLODipine (NORVASC) 5 MG tablet. Please f/u with pt.

## 2016-04-26 NOTE — Telephone Encounter (Signed)
Oncology Nurse Navigator Documentation  Oncology Nurse Navigator Flowsheets 04/17/2016 04/22/2016 04/26/2016  Navigator Location - - -  Navigator Encounter Type Treatment Treatment Telephone  Telephone - - Outgoing Call  Abnormal Finding Date - - -  Confirmed Diagnosis Date - - -  Treatment Initiated Date - - -  Patient Visit Type - RadOnc -  Treatment Phase - Treatment -  Barriers/Navigation Needs No barriers at this time Education Coordination of Care-I called Dr. Adrian Blackwater- primary care to get Austin Nicholson an appointment and a refill on his Norvasc.  He has not been taking his blood pressure medication because he is out and he does not have a follow up visit.  Appointment given for 6/2 arriving at 3:30pm. I will contact patient with this information.  Education - Other -  Interventions - Education Method Coordination of Care  Coordination of Care - - Appts  Education Method - Teach-back;Verbal -  Support Groups/Services - Friends and Family -  Acuity - - -  Acuity Level 1 - - -  Acuity Level 2 - - -  Time Spent with Patient 15 30 15

## 2016-04-29 ENCOUNTER — Encounter: Payer: Self-pay | Admitting: Medical Oncology

## 2016-04-29 ENCOUNTER — Ambulatory Visit
Admission: RE | Admit: 2016-04-29 | Discharge: 2016-04-29 | Disposition: A | Discharge status: Home / Self Care | Payer: Medicare Other | Source: Ambulatory Visit | Attending: Radiation Oncology | Admitting: Radiation Oncology

## 2016-04-29 DIAGNOSIS — C61 Malignant neoplasm of prostate: Secondary | ICD-10-CM | POA: Diagnosis not present

## 2016-04-29 DIAGNOSIS — Z510 Encounter for antineoplastic radiation therapy: Secondary | ICD-10-CM | POA: Diagnosis not present

## 2016-04-29 MED ORDER — AMLODIPINE BESYLATE 5 MG PO TABS: 5.0000 mg | ORAL_TABLET | Freq: Every day | ORAL | Status: AC

## 2016-04-29 NOTE — Telephone Encounter (Signed)
norvasc refilled Patient will be contacted by his pharmacy

## 2016-04-29 NOTE — Progress Notes (Signed)
Oncology Nurse Navigator Documentation  Oncology Nurse Navigator Flowsheets 04/26/2016 04/26/2016 04/29/2016  Navigator Location - - -  Navigator Encounter Type Telephone Telephone Treatment  Telephone Outgoing Call Outgoing Call;Appt Confirmation/Clarification -  Abnormal Finding Date - - -  Confirmed Diagnosis Date - - -  Treatment Initiated Date - - -  Patient Visit Type - - RadOnc  Treatment Phase - - Treatment  Barriers/Navigation Needs Coordination of Care Coordination of Care Education;Coordination of Care- I gave Austin Nicholson written information for appointment with Dr.Funches 05/10/16. I reviewed the dates and times, the  importance of keeping this appointment with his primary care MD. I asked if he picked up his Norvasc refill. He stated he has not but will pick it up today.  Education - - Other  Interventions Coordination of Care Coordination of Care Coordination of Care  Coordination of Care Appts Appts -  Education Method - - -  Support Groups/Services - - -  Acuity - - -  Acuity Level 1 - - -  Acuity Level 2 - - -  Time Spent with Patient 15 15 15

## 2016-04-30 ENCOUNTER — Ambulatory Visit
Admission: RE | Admit: 2016-04-30 | Discharge: 2016-04-30 | Disposition: A | Discharge status: Home / Self Care | Payer: Medicare Other | Source: Ambulatory Visit | Attending: Radiation Oncology | Admitting: Radiation Oncology

## 2016-04-30 DIAGNOSIS — C61 Malignant neoplasm of prostate: Secondary | ICD-10-CM | POA: Diagnosis not present

## 2016-04-30 DIAGNOSIS — Z510 Encounter for antineoplastic radiation therapy: Secondary | ICD-10-CM | POA: Diagnosis not present

## 2016-05-01 ENCOUNTER — Ambulatory Visit
Admission: RE | Admit: 2016-05-01 | Discharge: 2016-05-01 | Disposition: A | Discharge status: Home / Self Care | Payer: Medicare Other | Source: Ambulatory Visit | Attending: Radiation Oncology | Admitting: Radiation Oncology

## 2016-05-01 DIAGNOSIS — C61 Malignant neoplasm of prostate: Secondary | ICD-10-CM | POA: Diagnosis not present

## 2016-05-01 DIAGNOSIS — Z510 Encounter for antineoplastic radiation therapy: Secondary | ICD-10-CM | POA: Diagnosis not present

## 2016-05-02 ENCOUNTER — Ambulatory Visit
Admission: RE | Admit: 2016-05-02 | Discharge: 2016-05-02 | Disposition: A | Discharge status: Home / Self Care | Payer: Medicare Other | Source: Ambulatory Visit | Attending: Radiation Oncology | Admitting: Radiation Oncology

## 2016-05-02 DIAGNOSIS — Z510 Encounter for antineoplastic radiation therapy: Secondary | ICD-10-CM | POA: Diagnosis not present

## 2016-05-02 DIAGNOSIS — C61 Malignant neoplasm of prostate: Secondary | ICD-10-CM | POA: Diagnosis not present

## 2016-05-03 ENCOUNTER — Ambulatory Visit
Admission: RE | Admit: 2016-05-03 | Discharge: 2016-05-03 | Disposition: A | Discharge status: Home / Self Care | Payer: Medicare Other | Source: Ambulatory Visit | Attending: Radiation Oncology | Admitting: Radiation Oncology

## 2016-05-03 ENCOUNTER — Encounter: Payer: Self-pay | Admitting: Radiation Oncology

## 2016-05-03 VITALS — BP 182/75 | HR 63 | Resp 16 | Wt 187.7 lb

## 2016-05-03 DIAGNOSIS — C61 Malignant neoplasm of prostate: Secondary | ICD-10-CM | POA: Diagnosis not present

## 2016-05-03 DIAGNOSIS — Z510 Encounter for antineoplastic radiation therapy: Secondary | ICD-10-CM | POA: Diagnosis not present

## 2016-05-03 NOTE — Progress Notes (Signed)
  Radiation Oncology         [phone]   Name: Austin Nicholson MRN: [id number]   Date: 05/03/2016  DOB: 05-Jun-1928   Weekly Radiation Therapy Management    ICD-9-CM ICD-10-CM   1. Prostate cancer ([place]) 185 C61     Current Dose: 65 Gy  Planned Dose:  75 Gy  Narrative The patient presents for routine under treatment assessment.  Weight stable. BP elevated. Patient reports he received notification from the pharmacy this morning he has bp medication to pick up. Denies pain. Denies dysuria or hematuria. Reports nocturia x1. Reports urinary frequency. Reports occasional difficulty emptying his bladder. Describes a strong steady urine stream. Denies leakage or incontinence. Denies diarrhea. Denies fatigue.  Set-up films were reviewed. The chart was checked.  Physical Findings  weight is 187 lb 11.2 oz (85.14 kg). His blood pressure is 182/75 and his pulse is 63. His respiration is 16 and oxygen saturation is 100%.  Weight essentially stable. This is a pleasant male in no acute distress. He is alert and oriented.   Impression The patient is tolerating radiation.  Plan Continue treatment as planned.     Sheral Apley Tammi Klippel, M.D.    This document serves as a record of services personally performed by [name], MD. It was created on his behalf by Lendon Collar, a trained medical scribe. The creation of this record is based on the scribe's personal observations and the provider's statements to them. This document has been checked and approved by the attending provider.

## 2016-05-03 NOTE — Progress Notes (Signed)
Weight stable. BP elevated. Patient reports he received notification from the pharmacy this morning he has bp medication to pick up. Denies pain. Denies dysuria or hematuria. Reports nocturia x1. Reports urinary frequency. Reports occasional difficulty emptying his bladder. Describes a strong steady urine stream. Denies leakage or incontinence. Denies diarrhea. Denies fatigue.   BP 182/75 mmHg  Pulse 63  Resp 16  Wt 187 lb 11.2 oz (85.14 kg)  SpO2 100% Wt Readings from Last 3 Encounters:  05/03/16 187 lb 11.2 oz (85.14 kg)  04/26/16 187 lb 8 oz (85.049 kg)  04/18/16 189 lb 4.8 oz (85.866 kg)

## 2016-05-07 ENCOUNTER — Ambulatory Visit
Admission: RE | Admit: 2016-05-07 | Discharge: 2016-05-07 | Disposition: A | Discharge status: Home / Self Care | Payer: Medicare Other | Source: Ambulatory Visit | Attending: Radiation Oncology | Admitting: Radiation Oncology

## 2016-05-07 DIAGNOSIS — Z510 Encounter for antineoplastic radiation therapy: Secondary | ICD-10-CM | POA: Diagnosis not present

## 2016-05-07 DIAGNOSIS — C61 Malignant neoplasm of prostate: Secondary | ICD-10-CM | POA: Diagnosis not present

## 2016-05-08 ENCOUNTER — Ambulatory Visit
Admission: RE | Admit: 2016-05-08 | Discharge: 2016-05-08 | Disposition: A | Discharge status: Home / Self Care | Payer: Medicare Other | Source: Ambulatory Visit | Attending: Radiation Oncology | Admitting: Radiation Oncology

## 2016-05-08 DIAGNOSIS — Z510 Encounter for antineoplastic radiation therapy: Secondary | ICD-10-CM | POA: Diagnosis not present

## 2016-05-08 DIAGNOSIS — C61 Malignant neoplasm of prostate: Secondary | ICD-10-CM | POA: Diagnosis not present

## 2016-05-09 ENCOUNTER — Ambulatory Visit
Admission: RE | Admit: 2016-05-09 | Discharge: 2016-05-09 | Disposition: A | Discharge status: Home / Self Care | Payer: Medicare Other | Source: Ambulatory Visit | Attending: Radiation Oncology | Admitting: Radiation Oncology

## 2016-05-09 DIAGNOSIS — Z510 Encounter for antineoplastic radiation therapy: Secondary | ICD-10-CM | POA: Diagnosis not present

## 2016-05-09 DIAGNOSIS — C61 Malignant neoplasm of prostate: Secondary | ICD-10-CM | POA: Diagnosis not present

## 2016-05-10 ENCOUNTER — Ambulatory Visit
Admission: RE | Admit: 2016-05-10 | Discharge: 2016-05-10 | Disposition: A | Discharge status: Home / Self Care | Payer: Medicare Other | Source: Ambulatory Visit | Attending: Radiation Oncology | Admitting: Radiation Oncology

## 2016-05-10 ENCOUNTER — Ambulatory Visit: Payer: Medicare Other | Admitting: Family Medicine

## 2016-05-10 VITALS — BP 124/85 | HR 70 | Resp 16 | Wt 186.2 lb

## 2016-05-10 DIAGNOSIS — C61 Malignant neoplasm of prostate: Secondary | ICD-10-CM

## 2016-05-10 DIAGNOSIS — Z510 Encounter for antineoplastic radiation therapy: Secondary | ICD-10-CM | POA: Diagnosis not present

## 2016-05-10 NOTE — Progress Notes (Signed)
Weight and vitals stable. Denies pain. Reports he is aware his bp medications are ready to be picked up at the pharmacy but, he "hasn't gotten around to it yet." Denies pain. Denies dysuria or hematuria. Reports nocturia x 4-5. Reports urinary frequency. Reports occasional difficulty emptying his bladder. Reports his urine stream is strong and steady. Denies leakage or incontinence. Reports his bowels have altered this week between diarrhea and constipation. Reports mild fatigue. Excited to finish soon so he can spend more time playing the piano. Patient unaware of any follow up appointment with his urologist. Will follow up on this matter with [facility]. One month follow up appointment card given. Patient understands to contact this RN with future needs.   BP 124/85 mmHg  Pulse 70  Resp 16  Wt 186 lb 3.2 oz (84.46 kg)  SpO2 100%  Wt Readings from Last 3 Encounters:  05/10/16 186 lb 3.2 oz (84.46 kg)  05/03/16 187 lb 11.2 oz (85.14 kg)  04/26/16 187 lb 8 oz (85.049 kg)

## 2016-05-10 NOTE — Progress Notes (Signed)
  Radiation Oncology         [phone]   Name: Austin Nicholson MRN: [id number]   Date: 05/10/2016  DOB: 07/26/1928   Weekly Radiation Therapy Management    ICD-9-CM ICD-10-CM   1. Prostate cancer ([place]) 185 C61     Current Dose: 73 Gy  Planned Dose:  75 Gy  Narrative The patient presents for routine under treatment assessment.  Weight and vitals stable. Denies pain. Reports he is aware his bp medications are ready to be picked up at the pharmacy but, he "hasn't gotten around to it yet." Denies pain. Denies dysuria or hematuria. Reports nocturia x 4-5. Reports urinary frequency. Reports occasional difficulty emptying his bladder. Reports his urine stream is strong and steady. Denies leakage or incontinence. Reports his bowels have altered this week between diarrhea and constipation. Reports mild fatigue. Excited to finish soon so he can spend more time playing the piano. Patient unaware of any follow up appointment with his urologist. Will follow up on this matter with [facility].  Set-up films were reviewed. The chart was checked.  Physical Findings  weight is 186 lb 3.2 oz (84.46 kg). His blood pressure is 124/85 and his pulse is 70. His respiration is 16 and oxygen saturation is 100%.  Weight essentially stable. This is a pleasant male in no acute distress. He is alert and oriented.   Impression The patient is tolerating radiation.  Plan Continue treatment as planned. He finishes treatment tomorrow. We discussed that Imodium AD will help with his diarrhea. He has a follow up appointment scheduled 06/20/2016.Patient understand to contact this Joaquim Lai, RN with future needs.     Sheral Apley Tammi Klippel, M.D.    This document serves as a record of services personally performed by [name], MD. It was created on his behalf by Lendon Collar, a trained medical scribe. The creation of this record is based on the scribe's personal observations and the provider's statements to them.  This document has been checked and approved by the attending provider.

## 2016-05-13 ENCOUNTER — Encounter: Payer: Self-pay | Admitting: Medical Oncology

## 2016-05-13 ENCOUNTER — Encounter: Payer: Self-pay | Admitting: Radiation Oncology

## 2016-05-13 ENCOUNTER — Ambulatory Visit
Admission: RE | Admit: 2016-05-13 | Discharge: 2016-05-13 | Disposition: A | Discharge status: Home / Self Care | Payer: Medicare Other | Source: Ambulatory Visit | Attending: Radiation Oncology | Admitting: Radiation Oncology

## 2016-05-13 DIAGNOSIS — Z510 Encounter for antineoplastic radiation therapy: Secondary | ICD-10-CM | POA: Diagnosis not present

## 2016-05-13 DIAGNOSIS — C61 Malignant neoplasm of prostate: Secondary | ICD-10-CM | POA: Diagnosis not present

## 2016-05-13 NOTE — Progress Notes (Signed)
Oncology Nurse Navigator Documentation  Oncology Nurse Navigator Flowsheets 04/26/2016 04/29/2016 05/13/2016  Navigator Encounter Type Telephone Treatment Treatment  Telephone Outgoing Call;Appt Confirmation/Clarification - -  Patient Visit Type - RadOnc -  Treatment Phase - Treatment Final Radiation Tx;Treatment- Celebrated with Mr. Skoog as he rang the bell after completion of his last radiation treatment. He is excited to be finished with his treatments. He has had few side effects but some mild diarrhea. He was instructed by Dr. Tammi Klippel to use Imodium prn. He has a follow up appointment with Dr.Manning 7/13 and he does have an appointment card. I asked him to call if he has any questions or concerns.  Barriers/Navigation Needs Coordination of Care Education;Coordination of Care Education-Reminded Mr. Brahmbhatt to pick his blood pressure medication up and how important it is to be compliant. He states that he had to wait on his check but will pick it up today.  Education - Other Other  Interventions Coordination of Care Coordination of Care -  Coordination of Care Appts - -  Education Method - - Teach-back;Verbal  Support Groups/Services - - Friends and Family  Acuity - - -  Acuity Level 1 - - -  Acuity Level 2 - - -  Time Spent with Patient 15 15 30

## 2016-05-17 ENCOUNTER — Ambulatory Visit: Payer: Medicare Other | Admitting: Family Medicine

## 2016-05-17 NOTE — Progress Notes (Signed)
  Radiation Oncology         [phone] ________________________________  Name: Austin Nicholson MRN: [id number]  Date: 05/13/2016  DOB: [birth year]  End of Treatment Note   ICD-9-CM ICD-10-CM    1. Prostate cancer ([place]) 185 C61     DIAGNOSIS: 83 y.o. 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 The prostate, seminal vesicles, and pelvic lymph nodes were initially treated to 45 Gy in 25 fractions of 1.8 Gy  2. The prostate only was boosted to 75 Gy with 15 additional fractions of 2.0 Gy   Beams/energy:  1. The prostate, seminal vesicles, and pelvic lymph nodes were initially treated using VMAT intensity modulated radiotherapy delivering 6 megavolt photons. Image guidance was performed with CB-CT studies prior to each fraction. He was immobilized with a body fix lower extremity mold.  2. the prostate only was boosted using VMAT intensity modulated radiotherapy delivering 6 megavolt photons. Image guidance was performed with CB-CT studies prior to each fraction. He was immobilized with a body fix lower extremity mold.  Narrative: The patient tolerated radiation treatment relatively well.   The patient experienced some minor urinary irritation and modest fatigue.    Plan: The patient has completed radiation treatment. He will return to radiation oncology clinic for routine followup in one month. I advised him to call or return sooner if he has any questions or concerns related to his recovery or treatment. ________________________________  Sheral Apley. Tammi Klippel, M.D.

## 2016-05-29 ENCOUNTER — Telehealth: Payer: Self-pay | Admitting: Medical Oncology

## 2016-05-29 NOTE — Telephone Encounter (Signed)
Oncology Nurse Navigator Documentation  Oncology Nurse Navigator Flowsheets 04/29/2016 05/13/2016 05/29/2016  Navigator Encounter Type Treatment Treatment Telephone  Telephone - - Outgoing Call;Appt Confirmation/Clarification-I called Austin Nicholson to clarify follow up appointment with Dr. Tammi Klippel.He came earlier this week thinking he had missed his appointment with Dr. Tammi Klippel. His follow up appointment is July 13 at 10 am. He states that he did not look at his appointment card correctly. I asked if he was able to see Dr. Adrian Blackwater (primary care) that was scheduled for 6/2 at 3:30 pm. He states that he had trouble finding the building and did not make his appointment. I asked if he would like for me to reschedule. He asked for the number and states that he will call and reschedule. I stressed the importance of rescheduling this appointment and taking his blood pressure medication. He voiced understanding. I will follow up with Austin Nicholson when he returns to see Dr. Tammi Klippel 7/13.  Patient Visit Type RadOnc - -  Treatment Phase Treatment Final Radiation Tx;Treatment -  Barriers/Navigation Needs Education;Coordination of Care Education Coordination of Care  Education Other Other Other  Interventions Coordination of Care - Coordination of Care;Education Method  Coordination of Care - - Appts  Education Method - Teach-back;Verbal Verbal  Support Groups/Services - Friends and Family Friends and Family  Acuity - - -  Acuity Level 1 - - -  Time Spent with Patient [id number]

## 2016-06-19 ENCOUNTER — Encounter: Payer: Self-pay | Admitting: Radiation Oncology

## 2016-06-19 NOTE — Progress Notes (Signed)
[facility] confirms the patient was last seen there on 3/20. She reports his next schedule appointment is with Dr. Junious Silk on 7/20 at 1015

## 2016-06-20 ENCOUNTER — Ambulatory Visit: Admission: RE | Admit: 2016-06-20 | Payer: Medicare Other | Source: Ambulatory Visit | Admitting: Radiation Oncology

## 2016-06-24 ENCOUNTER — Telehealth: Payer: Self-pay | Admitting: Medical Oncology

## 2016-06-24 NOTE — Telephone Encounter (Signed)
Oncology Nurse Navigator Documentation  Oncology Nurse Navigator Flowsheets 05/13/2016 05/29/2016 06/24/2016  Navigator Encounter Type Treatment Telephone Telephone;Follow-up Appt- spoke with Mr. Bukhari regarding his missed appointment 06/20/16./ He states that he got confused on dates and he is sorry. I informed him that I will have Enid Derry in [place] ot reschedule. He requested morning if possible.  Telephone - Outgoing Call;Appt Confirmation/Clarification Outgoing Call  Patient Visit Type - - -  Treatment Phase Final Radiation Tx;Treatment - -  Barriers/Navigation Needs Education Coordination of Care Coordination of Care  Education Other Other -  Interventions - Coordination of Care;Education Method Coordination of Care- Message forwarded to [place] to reschedule missed appointment  Coordination of Care - Appts Appts  Education Method Teach-back;Verbal Verbal -  Support Groups/Services Friends and Family Friends and Family -  Acuity - - -  Acuity Level 1 - - -  Time Spent with Patient [id number]

## 2016-06-25 ENCOUNTER — Telehealth: Payer: Self-pay | Admitting: *Deleted

## 2016-06-25 NOTE — Telephone Encounter (Signed)
CALLED PATIENT TO INFORM THAT FU VISIT HAS BEEN RESCHEDULED FOR 07-11-16 @ 11 AM WITH DR. MANNING, LVM FOR A RETURN CALL

## 2016-06-27 DIAGNOSIS — C61 Malignant neoplasm of prostate: Secondary | ICD-10-CM | POA: Diagnosis not present

## 2016-07-11 ENCOUNTER — Ambulatory Visit
Admission: RE | Admit: 2016-07-11 | Discharge: 2016-07-11 | Disposition: A | Discharge status: Home / Self Care | Payer: Medicare Other | Source: Ambulatory Visit | Attending: Radiation Oncology | Admitting: Radiation Oncology

## 2016-07-11 ENCOUNTER — Telehealth: Payer: Self-pay | Admitting: Medical Oncology

## 2016-07-11 NOTE — Telephone Encounter (Signed)
I left Austin Nicholson a message reminding him of his appointment this morning with Dr. Tammi Klippel.

## 2016-08-05 ENCOUNTER — Encounter: Payer: Self-pay | Admitting: Medical Oncology

## 2016-08-05 NOTE — Progress Notes (Signed)
Austin Nicholson was a no show for follow up with Dr. Tammi Klippel 7/213/17 and 07/11/16. I left message for him to return my call but no response. I followed up with Dr. Junious Silk his urologist and he was seen 06/27/16 for Lupron and [name]. PSA was 0.22. He will follow up with Dr. Junious Silk in January 2018. I notified [name]- Dr. Johny Shears nurse.

## 2016-08-06 ENCOUNTER — Encounter: Payer: Self-pay | Admitting: Radiation Oncology

## 2016-08-06 NOTE — Progress Notes (Signed)
Unfortunately patient has not shown for follow up appointments with Dr. Tammi Klippel. Informed today by Cira Rue, prostate navigator, that the patient saw Dr. Junious Silk on 7/20, received his Lupron injection and his PSA on that day proved to be 0.22. Will inform Dr. Tammi Klippel of these findings.

## 2016-10-31 DIAGNOSIS — N179 Acute kidney failure, unspecified: Secondary | ICD-10-CM | POA: Diagnosis not present

## 2016-10-31 DIAGNOSIS — S42294A Other nondisplaced fracture of upper end of right humerus, initial encounter for closed fracture: Secondary | ICD-10-CM | POA: Diagnosis not present

## 2016-10-31 DIAGNOSIS — S42291A Other displaced fracture of upper end of right humerus, initial encounter for closed fracture: Secondary | ICD-10-CM | POA: Diagnosis not present

## 2016-10-31 DIAGNOSIS — Z8546 Personal history of malignant neoplasm of prostate: Secondary | ICD-10-CM | POA: Diagnosis not present

## 2016-10-31 DIAGNOSIS — S42211A Unspecified displaced fracture of surgical neck of right humerus, initial encounter for closed fracture: Secondary | ICD-10-CM | POA: Diagnosis not present

## 2016-10-31 DIAGNOSIS — M25511 Pain in right shoulder: Secondary | ICD-10-CM | POA: Diagnosis not present

## 2016-11-07 DIAGNOSIS — S42201A Unspecified fracture of upper end of right humerus, initial encounter for closed fracture: Secondary | ICD-10-CM | POA: Diagnosis not present

## 2016-11-15 DIAGNOSIS — S42201D Unspecified fracture of upper end of right humerus, subsequent encounter for fracture with routine healing: Secondary | ICD-10-CM | POA: Diagnosis not present

## 2016-11-15 DIAGNOSIS — S42411D Displaced simple supracondylar fracture without intercondylar fracture of right humerus, subsequent encounter for fracture with routine healing: Secondary | ICD-10-CM | POA: Diagnosis not present

## 2017-01-10 DIAGNOSIS — H2513 Age-related nuclear cataract, bilateral: Secondary | ICD-10-CM | POA: Diagnosis not present

## 2017-06-26 DIAGNOSIS — J22 Unspecified acute lower respiratory infection: Secondary | ICD-10-CM | POA: Diagnosis not present

## 2020-08-09 DEATH — deceased
